# Patient Record
Sex: Female | Born: 1970 | Race: White | Hispanic: No | Marital: Married | Smoking: Never smoker
Health system: Southern US, Community
[De-identification: ages and names within clinical notes are randomized; demographics above are authoritative.]

## PROBLEM LIST (undated history)

## (undated) DIAGNOSIS — G4730 Sleep apnea, unspecified: Secondary | ICD-10-CM

## (undated) DIAGNOSIS — A150 Tuberculosis of lung: Secondary | ICD-10-CM

## (undated) DIAGNOSIS — K5010 Crohn's disease of large intestine without complications: Secondary | ICD-10-CM

## (undated) DIAGNOSIS — E119 Type 2 diabetes mellitus without complications: Secondary | ICD-10-CM

## (undated) HISTORY — PX: COLON SURGERY: SHX602

## (undated) HISTORY — PX: OTHER SURGICAL HISTORY: SHX169

## (undated) HISTORY — PX: ABDOMINAL HYSTERECTOMY: SHX81

## (undated) HISTORY — PX: ILEOSTOMY: SHX1783

## (undated) HISTORY — PX: CHOLECYSTECTOMY: SHX55

## (undated) HISTORY — PX: REDUCTION MAMMAPLASTY: SUR839

---

## 1998-10-17 ENCOUNTER — Emergency Department (HOSPITAL_COMMUNITY): Admission: EM | Admit: 1998-10-17 | Discharge: 1998-10-17 | Payer: Self-pay

## 2007-03-21 ENCOUNTER — Encounter (HOSPITAL_BASED_OUTPATIENT_CLINIC_OR_DEPARTMENT_OTHER): Admission: RE | Admit: 2007-03-21 | Discharge: 2007-05-03 | Payer: Self-pay | Admitting: Internal Medicine

## 2007-04-05 ENCOUNTER — Ambulatory Visit (HOSPITAL_COMMUNITY): Admission: RE | Admit: 2007-04-05 | Discharge: 2007-04-05 | Payer: Self-pay | Admitting: Internal Medicine

## 2007-05-06 ENCOUNTER — Encounter (HOSPITAL_BASED_OUTPATIENT_CLINIC_OR_DEPARTMENT_OTHER): Admission: RE | Admit: 2007-05-06 | Discharge: 2007-06-20 | Payer: Self-pay | Admitting: Surgery

## 2007-06-21 ENCOUNTER — Encounter (HOSPITAL_BASED_OUTPATIENT_CLINIC_OR_DEPARTMENT_OTHER): Admission: RE | Admit: 2007-06-21 | Discharge: 2007-07-31 | Payer: Self-pay | Admitting: Surgery

## 2007-09-13 ENCOUNTER — Encounter (HOSPITAL_BASED_OUTPATIENT_CLINIC_OR_DEPARTMENT_OTHER): Admission: RE | Admit: 2007-09-13 | Discharge: 2007-10-22 | Payer: Self-pay | Admitting: Internal Medicine

## 2008-02-14 ENCOUNTER — Ambulatory Visit: Payer: Self-pay | Admitting: Diagnostic Radiology

## 2008-02-14 ENCOUNTER — Emergency Department (HOSPITAL_BASED_OUTPATIENT_CLINIC_OR_DEPARTMENT_OTHER): Admission: EM | Admit: 2008-02-14 | Discharge: 2008-02-14 | Payer: Self-pay | Admitting: Emergency Medicine

## 2008-04-09 ENCOUNTER — Encounter: Admission: RE | Admit: 2008-04-09 | Discharge: 2008-04-09 | Payer: Self-pay | Admitting: Family Medicine

## 2008-12-07 ENCOUNTER — Emergency Department (HOSPITAL_BASED_OUTPATIENT_CLINIC_OR_DEPARTMENT_OTHER): Admission: EM | Admit: 2008-12-07 | Discharge: 2008-12-07 | Payer: Self-pay | Admitting: Emergency Medicine

## 2009-03-25 ENCOUNTER — Encounter (HOSPITAL_BASED_OUTPATIENT_CLINIC_OR_DEPARTMENT_OTHER): Admission: RE | Admit: 2009-03-25 | Discharge: 2009-05-10 | Payer: Self-pay | Admitting: Internal Medicine

## 2009-03-25 ENCOUNTER — Ambulatory Visit (HOSPITAL_COMMUNITY): Admission: RE | Admit: 2009-03-25 | Discharge: 2009-03-25 | Payer: Self-pay | Admitting: Internal Medicine

## 2009-10-12 ENCOUNTER — Encounter: Admission: RE | Admit: 2009-10-12 | Discharge: 2009-10-12 | Payer: Self-pay | Admitting: Family Medicine

## 2010-05-26 LAB — COMPREHENSIVE METABOLIC PANEL
BUN: 14 mg/dL (ref 6–23)
CO2: 20 mEq/L (ref 19–32)
Calcium: 9.2 mg/dL (ref 8.4–10.5)
Chloride: 104 mEq/L (ref 96–112)
Creatinine, Ser: 0.9 mg/dL (ref 0.4–1.2)
GFR calc Af Amer: >60 mL/min (ref >60)
Glucose, Bld: 147 mg/dL — ABNORMAL HIGH (ref 70–99)
Potassium: 4.5 mEq/L (ref 3.5–5.1)
Sodium: 139 mEq/L (ref 135–145)
Total Protein: 8.6 g/dL — ABNORMAL HIGH (ref 6.0–8.3)

## 2010-07-05 NOTE — Assessment & Plan Note (Signed)
[facility]   NAME:  Sharon Moon, Sharon Moon                ACCOUNT NO.:  [id number]   MEDICAL RECORD NO.:  [id number]      DATE OF BIRTH:  03/30/1970   PHYSICIAN:  Theresia Majors. Tanda Rockers, M.D. VISIT DATE:  06/07/2007                                   OFFICE VISIT   SUBJECTIVE:  Sharon Moon is a 40 year old lady who is undergoing hyperbaric  oxygen treatment as an adjunct to the management of metastatic Crohn  disease.  In the interim, there has been no drainage, no fever, and no  excessive pain.There have been no symptoms referable to barotrauma,  oxygen toxicity, or claustrophobia.   OBJECTIVE:  Blood pressure is 152/88, respirations of 16, pulse rate  100, and temperature 98.9. HEENT:  Clear.  Neck:  Supple.  Lungs:  Clear.  Tympanic membranes are clear.  Inspection of the wound in the  gluteal fold shows continued contraction.  There is very scant clear  exudate.  There is no evidence of active infection or fistulization.  There has been significant reduction in volume and area.   ASSESSMENT:  Clinical response to hyperbaric oxygen.   PLAN:  We will complete a course of HBO for a total of 40 dives.  We  will reevaluate the patient in 1 week.      Harold A. Tanda Rockers, M.D.  Electronically Signed     HAN/MEDQ  D:  06/07/2007  T:  06/08/2007  Job:  [id number]

## 2010-07-05 NOTE — Assessment & Plan Note (Signed)
[facility]   NAME:  Sharon Moon, Sharon Moon                ACCOUNT NO.:  [id number]   MEDICAL RECORD NO.:  [id number]      DATE OF BIRTH:  09-10-1970   PHYSICIAN:  Theresia Majors. Tanda Rockers, M.D. VISIT DATE:  06/14/2007                                   OFFICE VISIT   SUBJECTIVE:  Sharon Moon is a 40 year old lady with severe metastatic  Crohn's disease.  We have been treating her adjunctively with hyperbaric  oxygen in conjunction with her surgeons at [facility].  There has been no  symptoms of barotrauma, claustrophobia, or oxygen toxicity.  She  continues to be ambulatory.  She was evaluated in [facility] this week  and was told that her wounds show impressive improvement and a request  has been made for [place] to continue her HBO until complete  reepithelialization.  There has been no interim pain, fever, or  drainage.   OBJECTIVE:  Blood pressure is 124/79, respirations 16, pulse rate 80,  temperature 98.  The HEENT exam is clear.  The wound shows 100%  granulation.  There is a 1.5-cm sinus at 9 o'clock that is nonfriable.  It easily admits a Q-tip.  There is no abscess.  The periwound tissue is  normal and supple.  No evidence of cellulitis or induration.  There is  advancing epithelium from the periphery.  The wounds were measured,  cataloged, and photographed.  Please refer to the data entries   ASSESSMENT:  Clinical improvement.   PLAN:  We will continue her HBO and continue moist dressings.      Harold A. Tanda Rockers, M.D.  Electronically Signed     HAN/MEDQ  D:  06/14/2007  T:  06/15/2007  Job:  [id number]

## 2010-07-05 NOTE — Assessment & Plan Note (Signed)
[facility]   NAME:  Sharon Moon, Sharon Moon                ACCOUNT NO.:  [id number]   MEDICAL RECORD NO.:  [id number]      DATE OF BIRTH:  12-09-1970   PHYSICIAN:  Theresia Majors. Tanda Rockers, M.D. VISIT DATE:  07/04/2007                                   OFFICE VISIT   SUBJECTIVE:  Sharon Moon is a 40 year old female who is undergoing  hyperbaric oxygen treatment for a malignant Crohn disease.  She has  completed  a total of 50 hyperbaric oxygen treatments.  In the interim,  she has had moderate drainage from the right sinus in the sacral area.  There has been no interim fever.  She continues to be ambulatory,  continues to use daily moist saline dressings.   OBJECTIVE:  Blood pressure is 132/82, respirations 16, pulse rate 96,  and temperature is 98.7.  The patient was examined from the prone  position in the inner gluteal fold.  The wound shows continued  contraction.  There was some yellowish discharge on the plain 0.25 inch  new gauze.  There is no evidence of abscess formation, hyperemia, or  tenderness.  On the EMLA block, wound curette was used to extend into  both sinus tracts at 3 and at 9 o'clock respectively.  No loculations  were discerned.  There was healthy-appearing subcutaneous tissue.  There  was no malodor.  Both sinuses were irrigated and a ribbon packing of  plain new gauze was reapplied.   ASSESSMENT:  Continued improvement of inner gluteal ulcer associated  with malignant Crohn disease.   PLAN:  Sharon Moon will be reevaluated by the surgery service at the  [facility], [place].  At this point, we would  recommend that we discontinue hyperbaric oxygen after 60 and resume her  local care.  Our recommendation will be modified by the recommendation  of her attending surgeon at [facility].  The patient is scheduled to be  reevaluated within the next 2 weeks and we will coordinate our treatment  plans.   We have discussed this approach with  the patient in terms that she seems  to understand.  She expresses gratitude for having been seen in the  clinic.  We will continue hyperbarics and moist moist dressings daily.   There was no excessive bleeding.  The wound was copiously irrigated with  saline.  No cultures were taken.  The subcutaneous tissue and fat  appeared to be normal.      Theresia Majors. Tanda Rockers, M.D.  Electronically Signed     HAN/MEDQ  D:  07/04/2007  T:  07/05/2007  Job:  [id number]

## 2010-07-05 NOTE — Assessment & Plan Note (Signed)
[facility]   NAME:  Sharon Moon, Sharon Moon                ACCOUNT NO.:  [id number]   MEDICAL RECORD NO.:  [id number]      DATE OF BIRTH:  Jan 22, 1971   PHYSICIAN:  Theresia Majors. Tanda Rockers, M.D. VISIT DATE:  06/28/2007                                   OFFICE VISIT   SUBJECTIVE:  Sharon Moon is a 40 year old lady who we are following and  treating with hyperbaric oxygen as an adjunct to the management of  metastatic Crohn disease.  In the interim, there has been no excessive  drainage.  She has no ear pressure or signs of claustrophobia or oxygen  toxicity.  She has completed diet #46.   OBJECTIVE:  Blood pressure is 131/83, respirations 14, pulse rate 90,  temperature 98.5.  The exam of the sacral area shows that there are  sinuses at 3 and 9 o'clock which have decreased significantly.  There is  no drainage whatsoever.  These areas appear to have healthy granulation  throughout.  The size of the sinus will not permit the cotton portion of  a Q-tip but there we are able to sound the tract with the wood end of  the Q-tip.  These were photographed, measured, and cataloged.  Please  refer to the data entries.   ASSESSMENT:  Satisfactory response to hyperbaric oxygen for the  management of metastatic Crohn disease.   PLAN:  We will place a plain new gauze ribbon packing into the wound.  We will irrigate the sinuses each day following her hyperbarics.  We  will continue hyperbarics and plan an interim evaluation at [facility] prior to  discharge.  We have given the patient opportunity to ask questions.  She  seems to understand the plan and expresses gratitude for having been  seen      Jake Shark A. Tanda Rockers, M.D.  Electronically Signed     HAN/MEDQ  D:  06/28/2007  T:  06/29/2007  Job:  [id number]

## 2010-07-05 NOTE — Assessment & Plan Note (Signed)
[facility]   NAME:  Sharon Moon, Sharon Moon                ACCOUNT NO.:  [id number]   MEDICAL RECORD NO.:  [id number]      DATE OF BIRTH:  01-04-1971   PHYSICIAN:  Theresia Majors. Tanda Rockers, M.D. VISIT DATE:  05/03/2007                                   OFFICE VISIT   SUBJECTIVE:  Sharon Moon is a 40 year old female who is undergoing  hyperbaric oxygen treatment for metastatic Crohn's disease. In the  interim, she has been utilizing a hydrogel pad b.i.d. There has been no  excessive drainage. There have been no barotrauma symptoms. No oxygen  toxicity symptoms rule out claustrophobic symptoms.   OBJECTIVE:  Blood pressure 128/79, respiratory rate 16, pulse 95,  temperature 98.5. Capillary blood glucose is 128 mg percent following  her HBO treatment. The patient has completed 17 out of 30 HBO  treatments. Inspection of the wound in the sacrum area shows 100%  granulation with advance in epithelium and scant drainage. There is no  evidence of active infection or fistulazation.   ASSESSMENT:  Clinical improvement attendant with HBO therapy.   PLAN:  We will continue the b.i.d. hydrogel dressings and we will  continue the HBO treatment. We will re-evaluate the patient in one week.      Harold A. Tanda Rockers, M.D.  Electronically Signed     HAN/MEDQ  D:  05/03/2007  T:  05/03/2007  Job:  [id number]

## 2010-07-05 NOTE — Assessment & Plan Note (Signed)
[facility]   NAME:  Sharon Moon, Sharon Moon                ACCOUNT NO.:  [id number]   MEDICAL RECORD NO.:  [id number]      DATE OF BIRTH:  02-May-1970   PHYSICIAN:  Maxwell Caul, M.D. VISIT DATE:  04/05/2007                                   OFFICE VISIT   [name] returns today in followup for her wounds related to metastatic  Crohn's disease.  She was seen in initial evaluation in this clinic last  week.  She has since been approved for a repeat trial of HBO.  Last  week, she had a culture done from the small wounds above her major  surgical wound in her coccyx area.  This came back as MRSA.  We called  in antibiotics doxycycline and rifampin.  Since then, she reports that  the major surgical wound itself is improved.  She has not noticed any  new changes.   On exam, temperature is 98.2, pulse 108, respirations 18, blood pressure  is 139/64.  The area on her surgical wound actually measures 5.2 x 0.4 x  1.  This looks slightly improved from the last time I saw this.  There  was a deeper tunnel which I probed in this last time which does not  appear to be evident.  Superior to this major surgical wound, one of a  small open areas has closed over.  There are only 2 of these versus 3  the last time.  I could express no purulent drainage this time.   IMPRESSION:  Skin wounds related to surgery and treatment for metastatic  Crohn's disease.  The patient has been approved for hyperbaric  oxygenation through insurance.  She will have blood work including a  basic metabolic panel, CBC, a chest x-ray, and an EKG as test for  upcoming hyperbaric oxygenation.  We wrote orders for hyperbaric  oxygenation and explained the risks and benefits to the patient.  She is  already had hyperbaric oxygenation at [facility] and expressed understanding  of the protocol.  She will continue with her WoundVac which will be  changed Monday, Wednesday, and Friday.     ______________________________  Maxwell Caul, M.D.     MGR/MEDQ  D:  04/05/2007  T:  04/07/2007  Job:  [id number]

## 2010-07-05 NOTE — Assessment & Plan Note (Signed)
[facility]   NAME:  Sharon Moon, HABLE                ACCOUNT NO.:  [id number]   MEDICAL RECORD NO.:  [id number]      DATE OF BIRTH:  [birth year]   PHYSICIAN:  Theresia Majors. Tanda Rockers, M.D. VISIT DATE:  05/16/2007                                   OFFICE VISIT   SUBJECTIVE:  Sharon Moon is a 40 year old lady who is undergoing hyperbaric  oxygen treatment as an adjunct to the management of severe complications  of Crohn's disease.  In the interim, she has denied symptoms consistent  with oxygen toxicity or barotrauma.  She returns for wound evaluation.  There has been no interim fever, malodorous drainage or pain.  She  continues to be ambulatory.   OBJECTIVE:  Blood pressure is 134/88, respirations are 14, pulse rate  97, temperature is 98.5.  Inspection of the wound shows a contracting  100% granulating base with advancing epithelium from the periphery.  There is no evidence of drainage.  There is no evidence of tracking,  malodor or fistula formation.  The patient had an interim culture which  showed abundant Staphylococcus aureus.   ASSESSMENT:  Clinical response to hyperbarics and local wound care.   PLAN:  We will continue her hyperbarics with reevaluation in 5 days. We  have chosen not to treat her with antibiotics but rather to depend upon  local antiseptic soap and vigilant wound care.   The patient will have an interim evaluation per Dr. Epifania Gore at [facility] for  independent assessment of progress and response.  We will likely extend  her total HBO treatments to effect complete closure.      Harold A. Tanda Rockers, M.D.  Electronically Signed     HAN/MEDQ  D:  05/16/2007  T:  05/16/2007  Job:  [id number]   cc:   Rae Halsted

## 2010-07-05 NOTE — Assessment & Plan Note (Signed)
[facility]   NAME:  ERCEL, NORMOYLE                ACCOUNT NO.:  [id number]   MEDICAL RECORD NO.:  [id number]      DATE OF BIRTH:  15-May-1970   PHYSICIAN:  Maxwell Caul, M.D. VISIT DATE:  04/26/2007                                   OFFICE VISIT   Mrs. Wurtz is a patient we have been following for wound care evaluation  in conjunction with her HBO for metastatic Crohn's disease.  She has  been to see her surgeon who has discontinued the wound vac and simply  recommended wet to dry. We have been packing this with hydrogel.  She  continues in HBO.   Wound exam. The wound  once again appears clean and well granulated.  There is no evidence of infection.  The superficial candidal infection  she had last time appears much better.  The three small wounds that were  superior to the surgical wound have all closed over.  I do not see any  particular reason for the Protopic at this point.  With regards to the  surgical wound itself, there has been some healing present especially  from the inferior part of the oval-shaped wound.   IMPRESSION:  Wounds related to metastatic Crohn's disease and surgery.  She is continuing with HBO.  There is no need for Protopic now.  I have  recommended hydrogel based packing.  We will continue to follow her in  conjunction with HBO.           ______________________________  Maxwell Caul, M.D.     MGR/MEDQ  D:  04/26/2007  T:  04/26/2007  Job:  [id number]

## 2010-07-05 NOTE — Assessment & Plan Note (Signed)
[facility]   NAME:  Sharon Moon, Sharon Moon                ACCOUNT NO.:  [id number]   MEDICAL RECORD NO.:  [id number]      DATE OF BIRTH:  Jan 22, 1971   PHYSICIAN:  Maxwell Caul, M.D. VISIT DATE:  04/12/2007                                   OFFICE VISIT   [name] was seen today in conjunction with her third HBO treatment.  She is a woman with metastatic Crohn's disease who has a surgical wound  in her buttocks.  All above the area are three small open areas which  initially grew MRSA.  She has been on doxycycline and rifampin for  these.  She has started on HBO which has previously helped her in the  past.   WOUND EXAM:  The surgical wound area in her buttocks has contracted in  size somewhat.  The tissue looks relatively healthy.  There was a small  deeper recess in this wound at one point that appears to have closed  down.  The small area superficially superior to the surgical wound look  much the same.  There is no drainage, however.   IMPRESSION:  Wounds related to metastatic Crohn's disease and surgery.  She is continuing with a wound vac to the surgical wound.  She has  started an HBO.  She is applying Protopic to the smaller areas above  this.  No further antibiotics are deemed necessary.  There was nothing  that appeared to require culturing.  We will follow her in conjunction  with HBO.           ______________________________  Maxwell Caul, M.D.     MGR/MEDQ  D:  04/12/2007  T:  04/13/2007  Job:  [id number]

## 2010-07-05 NOTE — Assessment & Plan Note (Signed)
[facility]   NAME:  Sharon Moon, Sharon Moon                ACCOUNT NO.:  [id number]   MEDICAL RECORD NO.:  [id number]      DATE OF BIRTH:  1970-02-29   PHYSICIAN:  Maxwell Caul, M.D. VISIT DATE:  10/07/2007                                   OFFICE VISIT   LOCATION:  [facility].   Mrs. Dollinger is a lady with known metastatic Crohn disease.  We had  previously treated her for perianal and coccyx involvement with  hyperbaric oxygen which finished in June 2009.  This was done in  conjunction with her gastroenterologist at [facility].  Her area has healed.  I saw her on September 16, 2007.  Again, she had developed a recurrent wound  in her left anterior groin area.  This was a small oval-shaped wound.  She had been given a bump in her prednisone from 10-40 by her  gastroenterologist; however, the wound has not healed.  When I saw her,  I recommended reapplication of Protopic in conjunction with her steroid  challenge.  She has done well.  She states that the wound has largely  closed over and she is here for our final review.   On examination, temperature is 98.4, pulse 88, respirations 16, and  blood pressure 149/95.  The area in the groin has completely healed over  and there is no remaining wound.  Her other areas in the coccyx area  have no wounds at this time.   IMPRESSION:  Wounds related to metastatic Crohn disease.  This has  completely resolved.  At this point, I think she can be discharged again  from the clinic; however, we will be prepared to see her again should  there be any further difficulties.           ______________________________  Maxwell Caul, M.D.     MGR/MEDQ  D:  10/07/2007  T:  10/07/2007  Job:  [id number]

## 2010-07-05 NOTE — Assessment & Plan Note (Signed)
[facility]   NAME:  Sharon Moon, Sharon Moon                ACCOUNT NO.:  [id number]   MEDICAL RECORD NO.:  [id number]      DATE OF BIRTH:  08-29-1970   PHYSICIAN:  Sharon Moon, M.D. VISIT DATE:  03/22/2007                                   OFFICE VISIT   Sharon Moon is a exceedingly complex patient who is referred here for our  review of perianal wounds related to metastatic Crohn disease.  She is  40 years old and was first diagnosed with Crohn disease the a  colonoscopy in 1994.  At that point she had a good response to simple  measures.  However post-pregnancy in the late 1990s, she was started on  Remicade infusions for severe and worsening Crohn disease.  She  developed a perianal fistulas and abscesses in the summer of 2001.  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  Ultimately in 2003  in [place], she required extensive surgery to repair of a hole in the  vaginal wall, and had graft site to repair a wound on her buttocks.  Skin biopsy in 2004 actually showed metastatic Crohn disease and she was  started on Remicade again.   As I understand her current problem, she required surgery on her  buttocks to attempt to clean up a continued fistula formation.  She  ended up with a surgical wound.  This has been very slow to heal.  She  was given a course of 40 dives in 2007 at [facility], which  closed her wounds.  However, he after the dives they ended the wounds  reopened.  She a most recently has had a wound vac to the surgical area  on her buttocks but it had been a major flare ups, including a raised  purple wound in her left groin area.  She now is on monthly infusions  Tysabri and I think is been put back on prednisone.   She has largely been referred here to consider an attempt at a retrial  of hyperbaric Moon.  If I understand the logic here, the hyperbaric  Moon did help with healing of her perianal wounds I think in 2007 at  [facility].  At which time they had nothing to modify the underlying  metastatic Crohn disease.  It is the general feeling at optimistically,  the [facility] will do that.  Hopefully she would have the same response to  hyperbaric Moon.   EXAMINATION:  Temperature 98.1, pulse 102, respirations 16, blood  pressure 124/71.  Generally remarkably well looking despite what she has been through.  SKIN:  The area in question, her major wound actually was on the buttock  over the coccyx area which is a large but well granulated wound  measuring 5.7 x 0.6 x 1.  There is a small deeper recess in this wound.  However, it does not probe to bone.  They have been applying a wound vac  to this changed by home health for several months now.  Cephalad to this  wound, there were 3 smaller open areas over her coccyx area.  One of  these had purulent drainage that I cultured.  I am not certain the exact  pathogenesis and progression of these wounds.  The final wound I looked  that was a raised purple area on her left groin area.  She has never had  surgery in this area.  There is a small opening present.   IMPRESSION:  Metastatic Crohn disease.  This in itself is apparently  made a very rare phenomenon.  To be truthful I had not heard of this  possibility.  Will attempt the female her gastroenterologist for more  information.  Nonetheless, she is on a monthly infusion of Tysabri and  had been put back on prednisone.  The major wound she has looks quite  amendable to a wound vac and I think this should continue.  The base of  this is well granulated.  Although I am not really certain about her  measurements as done by home health.  The smaller wounds cephalad to  this area no doubt have the same pathogenesis.  I have recommended  tacrolimus to this area.  The patient already has some of this at home.  The area on her left groin area I have no  specific recommendations for  and will see how this responds to the recent reduction of steroids.   With regards hyperbaric Moon.  I will see if we can get prior  authorization for this through [facility].  Although this  would be off label use of hyperbaric Moon.  She apparently had an  excellent response as previously with the wounds healing over nicely at  [facility] I think in 2007.  As I understand her justification, they are  hopeful that her current disease modifying medication will do better job  of making sure that recurrence does not happen after treatment.   PLAN:  We have gone ahead and tried to get prior authorization for  hyperbaric Moon.  I have prescribed a mattress overlay for her king  size bed to make sure that we keep the area off-loaded.  I think  tacrolimus might be the best way to address the small wounds just above  her major wound on her coccyx.  She already has some of this.  We will  see her back in two weeks' time, at which time we will see if we prior  authorization to proceed with hyperbaric.  If we do yet prior  authorization she will need a recent chest x-ray, EKG and lab work.           ______________________________  Sharon Moon, M.D.     MGR/MEDQ  D:  03/22/2007  T:  03/22/2007  Job:  [id number]   cc:   Dr. Vernell Barrier  Dr. Clovis Cao

## 2010-07-05 NOTE — Assessment & Plan Note (Signed)
[facility]   NAME:  Sharon Moon, Sharon Moon                ACCOUNT NO.:  [id number]   MEDICAL RECORD NO.:  [id number]      DATE OF BIRTH:  November 24, 1970   PHYSICIAN:  Theresia Majors. Tanda Rockers, M.D.      VISIT DATE:                                   OFFICE VISIT   SUBJECTIVE:  Sharon Moon is a 40 year old lady who is being followed  for malignant Crohn's disease.  She is undergoing hyperbaric oxygen  treatment.  There have been no symptoms referable to claustrophobia,  barotrauma or oxygen toxicity.  She continues to be ambulatory.  There  has been no excessive drainage.  She denies GI complaints altogether.   OBJECTIVE:  VITAL SIGNS:  Her blood pressure is 115/87, respirations of  14, pulse rate 98, temperature 98.6, capillary blood glucose not  applicable.   Examination of the patient from the prone position discloses a  continuously contracting wound.  There are sinuses at 3 and at 9  o'clock.  These are clean.  They were sounded with a Q-tip and extend 3-  1/2 and 3 cm respectively.  There is no malodor.  There is no evidence  of recurrent trauma.  There is no exposed bone.   ASSESSMENT:  Clinical improvement.   PLAN:  We continue the hyperbaric oxygen treatment with moist-moist  dressings and daily cleansing of sinuses.  We will re-evaluate the  patient in 1 week.      Harold A. Tanda Rockers, M.D.  Electronically Signed     HAN/MEDQ  D:  06/21/2007  T:  06/21/2007  Job:  [id number]

## 2010-07-05 NOTE — Assessment & Plan Note (Signed)
[facility]   NAME:  Sharon Moon, Sharon Moon                ACCOUNT NO.:  [id number]   MEDICAL RECORD NO.:  [id number]      DATE OF BIRTH:  09-08-1970   PHYSICIAN:  Theresia Majors. Tanda Rockers, M.D. VISIT DATE:  07/19/2007                                   OFFICE VISIT   SUBJECTIVE:  Sharon Moon is a 40 year old female who is undergoing  adjunctive hyperbaric oxygen therapy for malignant Crohn's disease with  fistulization in the intergluteal area.  She has completed a total of 58  dives out of a total ordered of 60.  There has been no excessive  drainage, malodor, pain, or fever.  She continues to be ambulatory.  There are no symptoms of barotrauma, oxygen toxicity, claustrophobia.   OBJECTIVE:  Blood pressure is 131/87, respirations are 14, pulse rate  82, and temperature 98.5.  The patient was examined in the prone  position.  The previous sinus at 9 o'clock is completely resolved.  The  sinus at 3 o'clock is measured approximately 2 mm and a depth of 4.2 cm.  There is no excessive drainage, malodor, or fluctuance.  There is  absolutely no evidence of abscess.   ASSESSMENT:  Clinical improvement of Crohn's fistula.   PLAN:  Will continue the hyperbaric oxygen for completion of her 60  dives.  We will reevaluate the patient in 1 week.      Harold A. Tanda Rockers, M.D.  Electronically Signed     HAN/MEDQ  D:  07/19/2007  T:  07/20/2007  Job:  [id number]

## 2010-07-05 NOTE — Assessment & Plan Note (Signed)
[facility]   NAME:  Sharon Moon, Sharon Moon                ACCOUNT NO.:  [id number]   MEDICAL RECORD NO.:  [id number]      DATE OF BIRTH:  [birth year]   PHYSICIAN:  Theresia Majors. Tanda Rockers, M.D. VISIT DATE:  05/23/2007                                   OFFICE VISIT   SUBJECTIVE:  Sharon Moon is a 40 year old female who is being seen in  consultation with [facility] Department of Surgery for hyperbaric oxygen  treatment of metastatic Crohn's disease.  She has completed a total of  30 HBO treatments. In the interim she has been seen by her surgeon, Dr.  Epifania Gore at [facility].  A request has been made to continue with the hyperbaric  oxygen treatment.  There has been no interim drainage.  There is no  malodor.  There is some moisture on her undergarment but there has been  no bloody drainage.  There has been no interim fever or GI symptoms.  Appetite remains good.  There are no symptoms of barotrauma,  claustrophobia or oxygen toxicity.   OBJECTIVE:  Blood pressure is 131/78, pulse rate 107, respirations 18,  temperature 99.4.  HEENT exam is clear.  The patient is examined from  the prone position.  The ulcer in the inner gluteal crease is clean.  There is a minimum waxy exudate which was mechanically debrided with a  4x4 gauze.  There was minimum hemorrhage, minimum pain.   ASSESSMENT:  Clinical response to hyperbaric oxygen treatment.   PLAN:  We will request a renewal for 30 dives from her insurance  carrier, continue her hyperbaric oxygen treatment as per previous  orders.  The end point of her therapy will be 30 additional dives or  complete closure.  We will reevaluate her in 5 days.      Harold A. Tanda Rockers, M.D.  Electronically Signed     HAN/MEDQ  D:  05/23/2007  T:  05/23/2007  Job:  [id number]

## 2010-07-05 NOTE — Assessment & Plan Note (Signed)
[facility]   NAME:  Sharon Moon, QUINONES                ACCOUNT NO.:  [id number]   MEDICAL RECORD NO.:  [id number]      DATE OF BIRTH:  04/06/1970   PHYSICIAN:  Maxwell Caul, M.D.      VISIT DATE:                                   OFFICE VISIT   Ms. [name] is a lady with a known metastatic Crohn disease.  We had  treated her for a perianal and coccyx involvement with hyperbaric oxygen  finishing at the beginning of June, she did well.  Her areas have  healed.  Per the patient, unfortunately, she developed a recurrent wound  in the left anterior groin area roughly in the femoral area.  The small  oval-shaped wound.  Her gastroenterologist in [facility] ([place]) gave her a  course of oral steroids, which per the patient has usually helped these  areas.  However, this is not healing and she arrived today in  consultation for [place] to look at this.   On examination, temperature is 98.5, pulse 85, respirations 18, and  blood pressure 154/89, indeed over the left groin area anteriorly is a  small circular wounds measuring 1.2 x 0.9 x 0.5.  This does not have any  evidence of infection.  The base of the wound appears to be granulating  appropriately, there is no substance to this.  Nothing is palpable and  there is no lymphadenopathy.   IMPRESSION:  Groin wound, secondary to probable metastatic Crohn  disease.  Although, I thought there was a differential to this, the  patient was able to show me and her right groin area wounds that have  actually looked similar to this one that has healed with the challenges  of oral steroids.  This one has not.  It does not appear to be infected.  It appears to be adequately hydrated.  There is no evidence of  surrounding cellulitis.  I have recommended applying Protopic 0.1%  b.i.d. to this wound covered with a bulky dressing to protect her from  irritation from her clothes.  At this point, I do not think this needs  anything additionally.  The  patient states that in the past, she has  been treated with initial wound steroid injections.  We cannot do that  here, I am not sure that I would think that that should be helpful in  any case.  We will see her back in clinic in 3 weeks' time.  The patient  was given a prescription for Protopic, she already has some at home and  we will see her in that timeframe earlier if necessary.           ______________________________  Maxwell Caul, M.D.     MGR/MEDQ  D:  09/16/2007  T:  09/17/2007  Job:  [id number]

## 2010-07-05 NOTE — Assessment & Plan Note (Signed)
[facility]   NAME:  Sharon Moon, Sharon Moon                ACCOUNT NO.:  [id number]   MEDICAL RECORD NO.:  [id number]      DATE OF BIRTH:  1970-12-12   PHYSICIAN:  Theresia Majors. Tanda Rockers, M.D. VISIT DATE:  07/12/2007                                   OFFICE VISIT   SUBJECTIVE:  Sharon Moon is a 40 year old lady who we are treating with  adjunctive hyperbaric oxygen therapy for malignant Crohn's disease  involving the intergluteal area.  In the interim, she has denied  excessive drainage, malodor, pain, or fever.  She has no GI symptoms.   She was seen earlier today by Dr. Clovis Cao at [facility] who has requested  that we continue her hyperbaric oxygen treatment until a complete  closure is effective.  She will be reevaluated by Dr. Karel Jarvis in 3 months.  Dr. Rae Halsted, surgeon has discharged her from active management.   She is tolerating the hyperbaric oxygen without difficulty.  She denies  any symptoms whatsoever claustrophobia, sinusitis, ear pain, or sinus  pressure.   OBJECTIVE:  Blood pressure is 128/86, respirations are 16, pulse rate  90, and temperature is 98.6.  Inspection of the intergluteal area shows  that the sinuses have remained patent at 3 o'clock.  The external  orifice is small and only permits the wooden portion of the Q-tip.  On  the right, the cotton tip is easily inserted into the sinus and extends  2.5 cm.  There is no abscess or loculation discernible.  The periwound  area is noninflamed, and there is no induration.  There is no malodor.   ASSESSMENT:  Continued improvement of wound, concurrent with hyperbaric  oxygen treatment.   PLAN:  We will continue the hyperbaric oxygen therapy at the current  settings.  We will reevaluate the patient in 5 days or after 5  additional treatments.      Harold A. Tanda Rockers, M.D.  Electronically Signed     HAN/MEDQ  D:  07/12/2007  T:  07/13/2007  Job:  [id number]   cc:   Jacklynn Ganong

## 2010-07-05 NOTE — Assessment & Plan Note (Signed)
[facility]   NAME:  Sharon Moon, Sharon Moon                ACCOUNT NO.:  [id number]   MEDICAL RECORD NO.:  [id number]      DATE OF BIRTH:  February 05, 1971   PHYSICIAN:  Maxwell Caul, M.D.      VISIT DATE:                                   OFFICE VISIT   [name] was seen today in conjunction with her HBO treatment.  She had  no complications during the dive.   On examination, her wound measurement currently measures 4.2 x 1.2 x  1.6.  There is some improvement in the length of the wound.  The tissue  continues to look well-granulated.  The small open area is superiorly  now measure 2 and are much smaller than what I remember.  She had  evidence of surrounding Candida around the wound, to which we applied  ketoconazole and gave her a prescription for Diflucan.   IMPRESSION:  Surgical wound, right buttock, with underlying metastatic  Crohn's disease.  We will continue with a wound vac as ordered and HBO.  I have given her prescriptions for Diflucan, for the candida surrounding  the wound.   She will continue using Protopic to the small areas above this.  No  further antibiotics are deemed necessary.  She will continue with HBO.           ______________________________  Maxwell Caul, M.D.     MGR/MEDQ  D:  04/19/2007  T:  04/20/2007  Job:  [id number]

## 2010-07-05 NOTE — Assessment & Plan Note (Signed)
[facility]   NAME:  Sharon Moon, Sharon Moon                ACCOUNT NO.:  [id number]   MEDICAL RECORD NO.:  [id number]      DATE OF BIRTH:  08/10/1970   PHYSICIAN:  Theresia Majors. Tanda Rockers, M.D. VISIT DATE:  07/26/2007                                   OFFICE VISIT   SUBJECTIVE:  Sharon Moon is a 40 year old female who we had followed for  malignant Crohn disease with sinuses in the sacrococcygeal area.  She  has undergone hyperbaric oxygen treatments, a total of 62.  She returns  for followup.  There has been no drainage, malodor, pain, or fever.  She  has no symptoms referable to barotrauma, oxygen toxicity, or  claustrophobia.   OBJECTIVE:  Blood pressure is 133/87, respirations 16, pulse rate 73,  and temperature 98.  The HEENT exam is clear.  Inspection of the  intergluteal area in the sacrococcygeal portion discloses that there is  a very minimum residual of the previous sinus.  The wound barely admits  the tip of a Q-tip, and there is no depth.  There is no drainage.  There  is no malodor.   ASSESSMENT:  Essential resolution of sinuses.   PLAN:  We are discontinuing the hyperbaric oxygen treatment.  We are  discharging the patient to return to the care of her primary care  physician and her referring physicians.  We have advised her to continue  daily tub baths utilizing antibacterial soap i.e., Liquid Dial.  We will  reevaluate her on a p.r.n. basis.   We have given the patient an opportunity to ask questions.  She  understands the instructions and expresses gratitude for having been  seen in the clinic.      Harold A. Tanda Rockers, M.D.  Electronically Signed     HAN/MEDQ  D:  07/26/2007  T:  07/27/2007  Job:  [id number]   cc:   Lorin Picket E. Karel Jarvis, MD  Loraine Leriche Epifania Gore

## 2010-07-05 NOTE — Assessment & Plan Note (Signed)
[facility]   NAME:  [name], [name]                ACCOUNT NO.:  [id number]   MEDICAL RECORD NO.:  [id number]      DATE OF BIRTH:  [birth year]   PHYSICIAN:  Maxwell Caul, M.D. VISIT DATE:  05/10/2007                                   OFFICE VISIT   Sharon Moon is a patient here we have been following in conjunction with  HBO for metastatic Crohn's disease.  She is also followed with her  surgeon in [place].  She has been treated with hydrogel to the  surgical wound on her buttocks area.  She also has had recurrently small  open areas in her coccyx area.  She is continued in HBO.   WOUND EXAM:  The wound continues to contract mostly from the inferior  aspect.  The base of this has healthy looking granulation.  There is no  evidence of infection.  No evidence of surrounding candidal reaction.  She does have two small areas cephalad to the actual surgical wound we  are treating.  One of these did have a fair amount of drainage today  which I cultured.  There was no overt evidence of cellulitis.  I did not  start her on empiric antibiotics.   IMPRESSION:  Wound related to metastatic Crohn's disease and recent  surgery.  She is continuing with HBO.  She will continue with hydrogel  packing.  She continues to applied Protopic to the very tiny open areas  cephalad to her surgical wound.  We will continue to follow her in  conjunction with HBO.   She had questions about sitting which I think she could do as long as  she keeps her weight over her ischial tuberosities.  I do not think she  would be able to maintain this for long.  We talked about this in some  detail.           ______________________________  Maxwell Caul, M.D.     MGR/MEDQ  D:  05/10/2007  T:  05/10/2007  Job:  [id number]

## 2010-10-12 ENCOUNTER — Other Ambulatory Visit: Payer: Self-pay | Admitting: Family Medicine

## 2010-10-12 DIAGNOSIS — Z1231 Encounter for screening mammogram for malignant neoplasm of breast: Secondary | ICD-10-CM

## 2010-10-20 ENCOUNTER — Ambulatory Visit (HOSPITAL_BASED_OUTPATIENT_CLINIC_OR_DEPARTMENT_OTHER)
Admission: RE | Admit: 2010-10-20 | Discharge: 2010-10-20 | Disposition: A | Discharge status: Home / Self Care | Payer: BC Managed Care – PPO | Source: Ambulatory Visit | Attending: Family Medicine | Admitting: Family Medicine

## 2010-10-20 DIAGNOSIS — Z1231 Encounter for screening mammogram for malignant neoplasm of breast: Secondary | ICD-10-CM

## 2010-11-11 LAB — DIFFERENTIAL
Basophils Absolute: 0
Eosinophils Relative: 1
Lymphs Abs: 4.3 — ABNORMAL HIGH
Monocytes Relative: 6
Neutrophils Relative %: 66

## 2010-11-11 LAB — CBC
MCHC: 34.1
MCV: 89.8
RBC: 5.2 — ABNORMAL HIGH
RDW: 24.9 — ABNORMAL HIGH

## 2010-11-11 LAB — BASIC METABOLIC PANEL
BUN: 12
CO2: 25
Calcium: 10.1
Chloride: 102
Creatinine, Ser: 0.74
Potassium: 4.4

## 2011-11-14 ENCOUNTER — Other Ambulatory Visit (HOSPITAL_BASED_OUTPATIENT_CLINIC_OR_DEPARTMENT_OTHER): Payer: Self-pay | Admitting: Obstetrics and Gynecology

## 2011-11-14 DIAGNOSIS — Z1231 Encounter for screening mammogram for malignant neoplasm of breast: Secondary | ICD-10-CM

## 2011-11-16 ENCOUNTER — Ambulatory Visit (HOSPITAL_BASED_OUTPATIENT_CLINIC_OR_DEPARTMENT_OTHER)
Admission: RE | Admit: 2011-11-16 | Discharge: 2011-11-16 | Disposition: A | Discharge status: Home / Self Care | Payer: BC Managed Care – PPO | Source: Ambulatory Visit | Attending: Obstetrics and Gynecology | Admitting: Obstetrics and Gynecology

## 2011-11-16 DIAGNOSIS — Z1231 Encounter for screening mammogram for malignant neoplasm of breast: Secondary | ICD-10-CM | POA: Insufficient documentation

## 2012-12-03 ENCOUNTER — Other Ambulatory Visit (HOSPITAL_BASED_OUTPATIENT_CLINIC_OR_DEPARTMENT_OTHER): Payer: Self-pay | Admitting: Obstetrics and Gynecology

## 2012-12-03 DIAGNOSIS — Z1231 Encounter for screening mammogram for malignant neoplasm of breast: Secondary | ICD-10-CM

## 2012-12-11 ENCOUNTER — Ambulatory Visit (HOSPITAL_BASED_OUTPATIENT_CLINIC_OR_DEPARTMENT_OTHER)
Admission: RE | Admit: 2012-12-11 | Discharge: 2012-12-11 | Disposition: A | Discharge status: Home / Self Care | Payer: BC Managed Care – PPO | Source: Ambulatory Visit | Attending: Obstetrics and Gynecology | Admitting: Obstetrics and Gynecology

## 2012-12-11 DIAGNOSIS — Z1231 Encounter for screening mammogram for malignant neoplasm of breast: Secondary | ICD-10-CM

## 2013-12-02 ENCOUNTER — Other Ambulatory Visit (HOSPITAL_BASED_OUTPATIENT_CLINIC_OR_DEPARTMENT_OTHER): Payer: Self-pay | Admitting: Obstetrics and Gynecology

## 2013-12-02 DIAGNOSIS — Z1231 Encounter for screening mammogram for malignant neoplasm of breast: Secondary | ICD-10-CM

## 2013-12-19 ENCOUNTER — Ambulatory Visit (HOSPITAL_BASED_OUTPATIENT_CLINIC_OR_DEPARTMENT_OTHER)
Admission: RE | Admit: 2013-12-19 | Discharge: 2013-12-19 | Disposition: A | Discharge status: Home / Self Care | Payer: BC Managed Care – PPO | Source: Ambulatory Visit | Attending: Diagnostic Radiology | Admitting: Diagnostic Radiology

## 2013-12-19 DIAGNOSIS — Z1231 Encounter for screening mammogram for malignant neoplasm of breast: Secondary | ICD-10-CM | POA: Insufficient documentation

## 2014-04-01 ENCOUNTER — Encounter (HOSPITAL_BASED_OUTPATIENT_CLINIC_OR_DEPARTMENT_OTHER): Payer: Self-pay | Admitting: *Deleted

## 2014-04-01 ENCOUNTER — Emergency Department (HOSPITAL_BASED_OUTPATIENT_CLINIC_OR_DEPARTMENT_OTHER): Payer: BLUE CROSS/BLUE SHIELD

## 2014-04-01 ENCOUNTER — Emergency Department (HOSPITAL_BASED_OUTPATIENT_CLINIC_OR_DEPARTMENT_OTHER)
Admission: EM | Admit: 2014-04-01 | Discharge: 2014-04-01 | Disposition: A | Discharge status: Home / Self Care | Payer: BLUE CROSS/BLUE SHIELD | Attending: Emergency Medicine | Admitting: Emergency Medicine

## 2014-04-01 DIAGNOSIS — N200 Calculus of kidney: Secondary | ICD-10-CM | POA: Diagnosis not present

## 2014-04-01 DIAGNOSIS — Z8719 Personal history of other diseases of the digestive system: Secondary | ICD-10-CM | POA: Diagnosis not present

## 2014-04-01 DIAGNOSIS — Z8611 Personal history of tuberculosis: Secondary | ICD-10-CM | POA: Insufficient documentation

## 2014-04-01 DIAGNOSIS — R1031 Right lower quadrant pain: Secondary | ICD-10-CM | POA: Diagnosis present

## 2014-04-01 DIAGNOSIS — N23 Unspecified renal colic: Secondary | ICD-10-CM | POA: Diagnosis not present

## 2014-04-01 DIAGNOSIS — N201 Calculus of ureter: Secondary | ICD-10-CM | POA: Diagnosis not present

## 2014-04-01 DIAGNOSIS — E119 Type 2 diabetes mellitus without complications: Secondary | ICD-10-CM | POA: Diagnosis not present

## 2014-04-01 DIAGNOSIS — R109 Unspecified abdominal pain: Secondary | ICD-10-CM

## 2014-04-01 DIAGNOSIS — Z8669 Personal history of other diseases of the nervous system and sense organs: Secondary | ICD-10-CM | POA: Insufficient documentation

## 2014-04-01 HISTORY — DX: Type 2 diabetes mellitus without complications: E11.9

## 2014-04-01 HISTORY — DX: Sleep apnea, unspecified: G47.30

## 2014-04-01 HISTORY — DX: Tuberculosis of lung: A15.0

## 2014-04-01 HISTORY — DX: Crohn's disease of large intestine without complications: K50.10

## 2014-04-01 LAB — COMPREHENSIVE METABOLIC PANEL
ALBUMIN: 4.1 g/dL (ref 3.5–5.2)
ALK PHOS: 101 U/L (ref 39–117)
ALT: 67 U/L — ABNORMAL HIGH (ref 0–35)
AST: 71 U/L — AB (ref 0–37)
Anion gap: 5 (ref 5–15)
BILIRUBIN TOTAL: 0.9 mg/dL (ref 0.3–1.2)
BUN: 12 mg/dL (ref 6–23)
CHLORIDE: 106 mmol/L (ref 96–112)
CO2: 24 mmol/L (ref 19–32)
Calcium: 9.3 mg/dL (ref 8.4–10.5)
Creatinine, Ser: 1.08 mg/dL (ref 0.50–1.10)
GFR calc Af Amer: 71 mL/min — ABNORMAL LOW (ref >90)
GFR calc non Af Amer: 61 mL/min — ABNORMAL LOW (ref >90)
Glucose, Bld: 116 mg/dL — ABNORMAL HIGH (ref 70–99)
POTASSIUM: 4.1 mmol/L (ref 3.5–5.1)
Sodium: 135 mmol/L (ref 135–145)
TOTAL PROTEIN: 8.4 g/dL — AB (ref 6.0–8.3)

## 2014-04-01 LAB — CBC WITH DIFFERENTIAL/PLATELET
BASOS ABS: 0 10*3/uL (ref 0.0–0.1)
Basophils Relative: 1 % (ref 0–1)
Eosinophils Absolute: 0.1 10*3/uL (ref 0.0–0.7)
Eosinophils Relative: 1 % (ref 0–5)
HEMATOCRIT: 42.5 % (ref 36.0–46.0)
Hemoglobin: 14.1 g/dL (ref 12.0–15.0)
LYMPHS PCT: 36 % (ref 12–46)
Lymphs Abs: 2.6 10*3/uL (ref 0.7–4.0)
MCH: 31.8 pg (ref 26.0–34.0)
MCHC: 33.2 g/dL (ref 30.0–36.0)
MCV: 95.7 fL (ref 78.0–100.0)
Monocytes Absolute: 0.7 10*3/uL (ref 0.1–1.0)
Monocytes Relative: 9 % (ref 3–12)
NEUTROS ABS: 3.8 10*3/uL (ref 1.7–7.7)
Neutrophils Relative %: 53 % (ref 43–77)
PLATELETS: 207 10*3/uL (ref 150–400)
RBC: 4.44 MIL/uL (ref 3.87–5.11)
RDW: 13.5 % (ref 11.5–15.5)
WBC: 7.1 10*3/uL (ref 4.0–10.5)

## 2014-04-01 LAB — URINALYSIS, ROUTINE W REFLEX MICROSCOPIC
BILIRUBIN URINE: NEGATIVE
GLUCOSE, UA: NEGATIVE mg/dL
Ketones, ur: 15 mg/dL — AB
Nitrite: NEGATIVE
PH: 5 (ref 5.0–8.0)
Protein, ur: NEGATIVE mg/dL
SPECIFIC GRAVITY, URINE: 1.015 (ref 1.005–1.030)
UROBILINOGEN UA: 0.2 mg/dL (ref 0.0–1.0)

## 2014-04-01 LAB — URINE MICROSCOPIC-ADD ON

## 2014-04-01 LAB — I-STAT CG4 LACTIC ACID, ED
LACTIC ACID, VENOUS: 2.7 mmol/L — AB (ref 0.5–2.0)
Lactic Acid, Venous: 2.46 mmol/L (ref 0.5–2.0)

## 2014-04-01 MED ORDER — MORPHINE SULFATE 4 MG/ML IJ SOLN
4.0000 mg | Freq: Once | INTRAMUSCULAR | Status: AC
  Administered 2014-04-01: 4 mg via INTRAVENOUS
  Filled 2014-04-01: qty 1

## 2014-04-01 MED ORDER — HYDROMORPHONE HCL 1 MG/ML IJ SOLN
1.0000 mg | Freq: Once | INTRAMUSCULAR | Status: AC
  Administered 2014-04-01: 1 mg via INTRAVENOUS
  Filled 2014-04-01: qty 1

## 2014-04-01 MED ORDER — PROMETHAZINE HCL 25 MG/ML IJ SOLN
25.0000 mg | Freq: Once | INTRAMUSCULAR | Status: AC
Start: 2014-04-01 — End: 2014-04-01
  Administered 2014-04-01: 25 mg via INTRAVENOUS
  Filled 2014-04-01: qty 1

## 2014-04-01 MED ORDER — IOHEXOL 300 MG/ML  SOLN
100.0000 mL | Freq: Once | INTRAMUSCULAR | Status: AC | PRN
  Administered 2014-04-01: 100 mL via INTRAVENOUS

## 2014-04-01 MED ORDER — OXYCODONE-ACETAMINOPHEN 5-325 MG PO TABS
2.0000 | ORAL_TABLET | Freq: Once | ORAL | Status: AC
  Administered 2014-04-01: 2 via ORAL
  Filled 2014-04-01: qty 2

## 2014-04-01 MED ORDER — ONDANSETRON HCL 4 MG/2ML IJ SOLN
4.0000 mg | Freq: Once | INTRAMUSCULAR | Status: AC
  Administered 2014-04-01: 4 mg via INTRAVENOUS
  Filled 2014-04-01: qty 2

## 2014-04-01 MED ORDER — OXYCODONE-ACETAMINOPHEN 5-325 MG PO TABS: 1.0000 | ORAL_TABLET | ORAL | Status: AC | PRN

## 2014-04-01 MED ORDER — IOHEXOL 300 MG/ML  SOLN
50.0000 mL | Freq: Once | INTRAMUSCULAR | Status: AC | PRN
  Administered 2014-04-01: 50 mL via ORAL

## 2014-04-01 MED ORDER — DIAZEPAM 5 MG/ML IJ SOLN
5.0000 mg | Freq: Once | INTRAMUSCULAR | Status: AC
Start: 2014-04-01 — End: 2014-04-01
  Administered 2014-04-01: 5 mg via INTRAVENOUS
  Filled 2014-04-01: qty 2

## 2014-04-01 MED ORDER — ONDANSETRON 4 MG PO TBDP: ORAL_TABLET | ORAL | Status: AC

## 2014-04-01 NOTE — ED Notes (Signed)
EDPA notified of pt request for additional pain med

## 2014-04-01 NOTE — Discharge Instructions (Signed)
Please do not take your Metformin for 48 hours to allow your kidneys to process the CT scan dye.   1. Medications: percocet, usual home medications 2. Treatment: rest, drink plenty of fluids,  3. Follow Up: Please followup with urology in 2-5 days days for discussion of your diagnoses and further evaluation after today's visit; if you do not have a primary care doctor use the resource guide provided to find one; Please return to the ER for intractable vomiting, worsening pain or fevers.   Kidney Stones Kidney stones (urolithiasis) are deposits that form inside your kidneys. The intense pain is caused by the stone moving through the urinary tract. When the stone moves, the ureter goes into spasm around the stone. The stone is usually passed in the urine.  CAUSES   A disorder that makes certain neck glands produce too much parathyroid hormone (primary hyperparathyroidism).  A buildup of uric acid crystals, similar to gout in your joints.  Narrowing (stricture) of the ureter.  A kidney obstruction present at birth (congenital obstruction).  Previous surgery on the kidney or ureters.  Numerous kidney infections. SYMPTOMS   Feeling sick to your stomach (nauseous).  Throwing up (vomiting).  Blood in the urine (hematuria).  Pain that usually spreads (radiates) to the groin.  Frequency or urgency of urination. DIAGNOSIS   Taking a history and physical exam.  Blood or urine tests.  CT scan.  Occasionally, an examination of the inside of the urinary bladder (cystoscopy) is performed. TREATMENT   Observation.  Increasing your fluid intake.  Extracorporeal shock wave lithotripsy--This is a noninvasive procedure that uses shock waves to break up kidney stones.  Surgery may be needed if you have severe pain or persistent obstruction. There are various surgical procedures. Most of the procedures are performed with the use of small instruments. Only small incisions are needed to  accommodate these instruments, so recovery time is minimized. The size, location, and chemical composition are all important variables that will determine the proper choice of action for you. Talk to your health care provider to better understand your situation so that you will minimize the risk of injury to yourself and your kidney.  HOME CARE INSTRUCTIONS   Drink enough water and fluids to keep your urine clear or pale yellow. This will help you to pass the stone or stone fragments.  Strain all urine through the provided strainer. Keep all particulate matter and stones for your health care provider to see. The stone causing the pain may be as small as a grain of salt. It is very important to use the strainer each and every time you pass your urine. The collection of your stone will allow your health care provider to analyze it and verify that a stone has actually passed. The stone analysis will often identify what you can do to reduce the incidence of recurrences.  Only take over-the-counter or prescription medicines for pain, discomfort, or fever as directed by your health care provider.  Make a follow-up appointment with your health care provider as directed.  Get follow-up X-rays if required. The absence of pain does not always mean that the stone has passed. It may have only stopped moving. If the urine remains completely obstructed, it can cause loss of kidney function or even complete destruction of the kidney. It is your responsibility to make sure X-rays and follow-ups are completed. Ultrasounds of the kidney can show blockages and the status of the kidney. Ultrasounds are not associated with any radiation  and can be performed easily in a matter of minutes. SEEK MEDICAL CARE IF:  You experience pain that is progressive and unresponsive to any pain medicine you have been prescribed. SEEK IMMEDIATE MEDICAL CARE IF:   Pain cannot be controlled with the prescribed medicine.  You have a  fever or shaking chills.  The severity or intensity of pain increases over 18 hours and is not relieved by pain medicine.  You develop a new onset of abdominal pain.  You feel faint or pass out.  You are unable to urinate. MAKE SURE YOU:   Understand these instructions.  Will watch your condition.  Will get help right away if you are not doing well or get worse. Document Released: 02/06/2005 Document Revised: 10/09/2012 Document Reviewed: 07/10/2012 [facility] Patient Information 2015 [place], [place]. This information is not intended to replace advice given to you by your health care provider. Make sure you discuss any questions you have with your health care provider.

## 2014-04-01 NOTE — ED Notes (Signed)
Pt c/o lower abd pain radiating around to her back  That began at 8:00 am. 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 She sts that she can normally feel when she has a blockage but this feels like mostly gas.

## 2014-04-01 NOTE — ED Provider Notes (Signed)
CSN: [id number]     Arrival date & time 04/01/14  1049 History   First MD Initiated Contact with Patient 04/01/14 1210     Chief Complaint  Patient presents with  . Abdominal Pain     (Consider location/radiation/quality/duration/timing/severity/associated sxs/prior Treatment) The history is provided by the patient and medical records. No language interpreter was used.      Sharon Moon is a 44 y.o. female  with a hx of Crohn's colitis, SBO, NIDDM, h/o proctocolectomy, positive TB test (on Rifampin 3 days) presents to the Emergency Department complaining of gradual, persistent, progressively worsening generalized abd pain worse in the bilateral lower abd with associated nausea onset 8am this morning. Associated symptoms include lower abd pressure and nausea.  Pt reports no urinary symptoms.  Pt reports her colostomy bag was full of feces this morning per the usual and she has had minimal out put since that time.  Nothing makes symptoms better or worse.  No treatments PTA.  Pt denies fever, chills, headache, neck pain, CP, SOB, vomiting, diarrhea, weakness, dizziness, syncope, dysuria.  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  Pt reports both previous SBOs were resolved medically.  Last one was approx 1 year ago.    Past Medical History  Diagnosis Date  . Crohn's colitis   . Sleep apnea   . Diabetes mellitus without complication   . TB (pulmonary tuberculosis)    Past Surgical History  Procedure Laterality Date  . Abdominal hysterectomy    . Ileostomy    . Cholecystectomy    . Colon surgery    . Proctocoloectomy    . Vaginal flap     No family history on file. History  Substance Use Topics  . Smoking status: Never Smoker   . Smokeless tobacco: Not on file  . Alcohol Use: No   OB History    No data available     Review of Systems  Constitutional: Negative for fever, diaphoresis, appetite change, fatigue and unexpected weight change.  HENT: Negative for mouth sores.    Eyes: Negative for visual disturbance.  Respiratory: Negative for cough, chest tightness, shortness of breath and wheezing.   Cardiovascular: Negative for chest pain.  Gastrointestinal: Positive for abdominal pain. Negative for nausea, vomiting, diarrhea and constipation.  Endocrine: Negative for polydipsia, polyphagia and polyuria.  Genitourinary: Negative for dysuria, urgency, frequency and hematuria.  Musculoskeletal: Negative for back pain and neck stiffness.  Skin: Negative for rash.  Allergic/Immunologic: Negative for immunocompromised state.  Neurological: Negative for syncope, light-headedness and headaches.  Hematological: Does not bruise/bleed easily.  Psychiatric/Behavioral: Negative for sleep disturbance. The patient is not nervous/anxious.       Allergies  Reglan and Vancomycin  Home Medications   Prior to Admission medications   Medication Sig Start Date End Date Taking? Authorizing Provider  ondansetron (ZOFRAN ODT) 4 MG disintegrating tablet 4mg  ODT q4 hours prn nausea/vomit 04/01/14   Kendel Bessey, PA-C  oxyCODONE-acetaminophen (PERCOCET) 5-325 MG per tablet Take 1-2 tablets by mouth every 4 (four) hours as needed. 04/01/14   Johannes Everage, PA-C   BP 126/64 mmHg  Pulse 83  Temp(Src) 98.6 F (37 C) (Oral)  Resp 18  Ht 5\' 7"  (1.702 m)  Wt 292 lb (132.45 kg)  BMI 45.72 kg/m2  SpO2 96%  LMP 10/18/2010 Physical Exam  Constitutional: She appears well-developed and well-nourished.  HENT:  Head: Normocephalic and atraumatic.  Mouth/Throat: Oropharynx is clear and moist.  Eyes: Conjunctivae are normal. No scleral  icterus.  Cardiovascular: Normal rate, regular rhythm, normal heart sounds and intact distal pulses.   Pulmonary/Chest: Effort normal and breath sounds normal.  Abdominal: Soft. She exhibits no distension, no fluid wave and no mass. Bowel sounds are decreased. There is tenderness in the right lower quadrant and left lower quadrant. There is  guarding. There is no rebound and no CVA tenderness.  Right lower and left lower quadrant abdominal pain with guarding, no rebound or peritoneal signs No CVA tenderness Ileostomy in place in the right lower quadrant with mildly erythematous borders but no purulent drainage, no diarrhea in the colostomy bag Bowel sounds decreased but present No distention or fluid wave noted  Neurological: She is alert. She exhibits normal muscle tone. Coordination normal.  Skin: Skin is warm and dry. No erythema.  Psychiatric: She has a normal mood and affect.  Nursing note and vitals reviewed.   ED Course  Procedures (including critical care time) Labs Review Labs Reviewed  COMPREHENSIVE METABOLIC PANEL - Abnormal; Notable for the following:    Glucose, Bld 116 (*)    Total Protein 8.4 (*)    AST 71 (*)    ALT 67 (*)    GFR calc non Af Amer 61 (*)    GFR calc Af Amer 71 (*)    All other components within normal limits  URINALYSIS, ROUTINE W REFLEX MICROSCOPIC - Abnormal; Notable for the following:    Color, Urine ORANGE (*)    Hgb urine dipstick TRACE (*)    Ketones, ur 15 (*)    Leukocytes, UA SMALL (*)    All other components within normal limits  URINE MICROSCOPIC-ADD ON - Abnormal; Notable for the following:    Squamous Epithelial / LPF FEW (*)    All other components within normal limits  I-STAT CG4 LACTIC ACID, ED - Abnormal; Notable for the following:    Lactic Acid, Venous 2.70 (*)    All other components within normal limits  I-STAT CG4 LACTIC ACID, ED - Abnormal; Notable for the following:    Lactic Acid, Venous 2.46 (*)    All other components within normal limits  CBC WITH DIFFERENTIAL/PLATELET    Imaging Review Ct Abdomen Pelvis W Contrast  04/01/2014   CLINICAL DATA:  Lower abdominal pain with nausea up. History of Crohn's disease with proctocolectomy. Ileostomy. Additional history of hysterectomy, appendectomy and cholecystectomy.  EXAM: CT ABDOMEN AND PELVIS WITH CONTRAST   TECHNIQUE: Multidetector CT imaging of the abdomen and pelvis was performed using the standard protocol following bolus administration of intravenous contrast.  CONTRAST:  50mL OMNIPAQUE IOHEXOL 300 MG/ML SOLN, OMNIPAQUE IOHEXOL 300 MG/ML SOLN  COMPARISON:  CT 10/10/2012  FINDINGS: Lower chest:  Lung bases are clear.  Hepatobiliary: No focal hepatic lesion.  Post cholecystectomy.  Pancreas: Pancreas is normal. No ductal dilatation. No pancreatic inflammation.  Spleen: Normal spleen  Adrenals/urinary tract: Adrenal glands are normal.  There is renal edema and hydronephrosis of the right kidney. There is hydroureter on the right. These obstructive findings aer secondary to a distal calculus measuring approximately 3 mm at the vesicoureteral junction (image 90, series 2). The bladder is low within the pelvis resultant from prior surgeries removing the uterus and colon.  There are 8 additional right renal calculi ranging size from 2-7 mm. There 6 left renal calculi ranging size from 4 to 7 mm. No left ureterolithiasis.  Stomach/Bowel: The stomach, stomach, duodenum, and small bowel are normal thyroid obstruction. There is a right lower quadrant ileostomy.  There is peristomal hernia without evidence of obstruction. No evidence of small bowel inflammation, abscess, or fistula.  Vascular/Lymphatic: Abdominal aorta is normal caliber. There is no retroperitoneal or periportal lymphadenopathy. No pelvic lymphadenopathy.  Reproductive: Surgically absent uterus  Musculoskeletal: No aggressive osseous lesion.  Other: No free fluid the abdomen pelvis.  IMPRESSION: 1. Obstructing calculus within the distal right ureter at the the vesicoureteral junction with mild hydronephrosis and hydroureter on the right as well as renal edema. 2. Bilateral nephrolithiasis. 3. The bladder is low and posterior in the pelvis. 4. Right lower quadrant ileostomy. No evidence of small bowel inflammation.   Electronically Signed   By: Genevive Bi M.D.   On: 04/01/2014 16:13   Dg Abd Acute W/chest  04/01/2014   CLINICAL DATA:  44 year old female with lower abdominal pain radiating into the back  EXAM: ACUTE ABDOMEN SERIES (ABDOMEN 2 VIEW & CHEST 1 VIEW)  COMPARISON:  Chest x-ray 03/23/2014; prior CT abdomen/ pelvis 10/10/2012  FINDINGS: Normal cardiac and mediastinal contours.  The lungs are clear.  No evidence of free air. The bowel gas pattern is not obstructed. Surgical clips in the right upper quadrant suggest prior cholecystectomy. Right lower quadrant ileostomy. Numerous opacities project over both kidneys consistent with nephrolithiasis. Several of the calcifications demonstrated appearance consistent with a renal appear mid this suggesting medullary nephrocalcinosis. No definite stone along the course of the ureters. Stable bone island in the right iliac wing. No acute osseous abnormality.  IMPRESSION: 1. 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 2. Normal bowel gas pattern without evidence of obstruction. 3. Surgical changes of prior cholecystectomy and right lower quadrant ileostomy. 4. No acute cardiopulmonary process.   Electronically Signed   By: Malachy Moan M.D.   On: 04/01/2014 13:10     EKG Interpretation None      MDM   Final diagnoses:  Abdominal pain, acute  Renal colic  Right ureteral stone  Nephrolithiasis    BROOKS STOTZ presents with sudden onset lower abdominal pain. Patient reports history of small bowel obstruction with similar symptoms in the past. She is a history of Crohn's disease and denies any sort of recent flare.  She denies taking any medications at home. Will give pain control, check labs and obtain plain film.  1:30 PM Plain film without evidence of bowel obstruction and normal gas pattern. Bilateral nephrolithiasis noted.  Will obtain CT scan for further characterization of patient's pain.  Repeat pain control.  The patient was discussed with and seen by  Dr. Donnald Garre who  Recommends discharge home if patient CT is negative.   5:00 PM CT scan with obstructing calculus measured at 3 mm and the right distal ureter with renal edema. Discussed with Dr Laverle Patter who reviewed the imaging with me.  He is comfortable with d/c home and office follow-up.    No evidence of infection. Patient is afebrile and without evidence of UTI on UA. Her pain is controlled. She does not have intractable vomiting and she has been in to tolerate by mouth fluids here in the emergency department.  I have personally reviewed patient's vitals, nursing note and any pertinent labs or imaging.  I performed an focused physical exam; undressed when appropriate .    It has been determined that no acute conditions requiring further emergency intervention are present at this time. The patient/guardian have been advised of the diagnosis and plan. I reviewed any labs and imaging including any potential incidental findings. We have discussed signs and  symptoms that warrant return to the ED and they are listed in the discharge instructions.    Vital signs are stable at discharge.   BP 126/64 mmHg  Pulse 83  Temp(Src) 98.6 F (37 C) (Oral)  Resp 18  Ht 5\' 7"  (1.702 m)  Wt 292 lb (132.45 kg)  BMI 45.72 kg/m2  SpO2 96%  LMP 10/18/2010        Dierdre ForthHannah Mazi Schuff, PA-C 04/01/14 1746  Arby BarretteMarcy Pfeiffer, MD 04/02/14 [phone]

## 2014-11-19 ENCOUNTER — Other Ambulatory Visit (HOSPITAL_BASED_OUTPATIENT_CLINIC_OR_DEPARTMENT_OTHER): Payer: Self-pay | Admitting: Obstetrics and Gynecology

## 2014-11-19 DIAGNOSIS — Z1231 Encounter for screening mammogram for malignant neoplasm of breast: Secondary | ICD-10-CM

## 2014-12-21 ENCOUNTER — Ambulatory Visit (HOSPITAL_BASED_OUTPATIENT_CLINIC_OR_DEPARTMENT_OTHER)
Admission: RE | Admit: 2014-12-21 | Discharge: 2014-12-21 | Disposition: A | Discharge status: Home / Self Care | Payer: BLUE CROSS/BLUE SHIELD | Source: Ambulatory Visit | Attending: Obstetrics and Gynecology | Admitting: Obstetrics and Gynecology

## 2014-12-21 DIAGNOSIS — Z1231 Encounter for screening mammogram for malignant neoplasm of breast: Secondary | ICD-10-CM | POA: Insufficient documentation

## 2015-11-22 ENCOUNTER — Other Ambulatory Visit (HOSPITAL_BASED_OUTPATIENT_CLINIC_OR_DEPARTMENT_OTHER): Payer: Self-pay | Admitting: Obstetrics and Gynecology

## 2015-11-22 DIAGNOSIS — Z1231 Encounter for screening mammogram for malignant neoplasm of breast: Secondary | ICD-10-CM

## 2015-12-23 ENCOUNTER — Ambulatory Visit (HOSPITAL_BASED_OUTPATIENT_CLINIC_OR_DEPARTMENT_OTHER)
Admission: RE | Admit: 2015-12-23 | Discharge: 2015-12-23 | Disposition: A | Discharge status: Home / Self Care | Payer: BLUE CROSS/BLUE SHIELD | Source: Ambulatory Visit | Attending: Obstetrics and Gynecology | Admitting: Obstetrics and Gynecology

## 2015-12-23 DIAGNOSIS — Z1231 Encounter for screening mammogram for malignant neoplasm of breast: Secondary | ICD-10-CM | POA: Insufficient documentation

## 2016-11-17 ENCOUNTER — Other Ambulatory Visit (HOSPITAL_BASED_OUTPATIENT_CLINIC_OR_DEPARTMENT_OTHER): Payer: Self-pay | Admitting: *Deleted

## 2016-11-17 ENCOUNTER — Other Ambulatory Visit (HOSPITAL_BASED_OUTPATIENT_CLINIC_OR_DEPARTMENT_OTHER): Payer: Self-pay | Admitting: Obstetrics and Gynecology

## 2016-11-17 DIAGNOSIS — Z1231 Encounter for screening mammogram for malignant neoplasm of breast: Secondary | ICD-10-CM

## 2016-12-25 ENCOUNTER — Ambulatory Visit (HOSPITAL_BASED_OUTPATIENT_CLINIC_OR_DEPARTMENT_OTHER)
Admission: RE | Admit: 2016-12-25 | Discharge: 2016-12-25 | Disposition: A | Discharge status: Home / Self Care | Payer: BLUE CROSS/BLUE SHIELD | Source: Ambulatory Visit | Attending: Obstetrics and Gynecology | Admitting: Obstetrics and Gynecology

## 2016-12-25 ENCOUNTER — Encounter (HOSPITAL_BASED_OUTPATIENT_CLINIC_OR_DEPARTMENT_OTHER): Payer: Self-pay

## 2016-12-25 DIAGNOSIS — Z1231 Encounter for screening mammogram for malignant neoplasm of breast: Secondary | ICD-10-CM | POA: Insufficient documentation

## 2017-11-27 ENCOUNTER — Other Ambulatory Visit (HOSPITAL_BASED_OUTPATIENT_CLINIC_OR_DEPARTMENT_OTHER): Payer: Self-pay | Admitting: Obstetrics and Gynecology

## 2017-11-27 DIAGNOSIS — Z1231 Encounter for screening mammogram for malignant neoplasm of breast: Secondary | ICD-10-CM

## 2017-12-31 ENCOUNTER — Ambulatory Visit (HOSPITAL_BASED_OUTPATIENT_CLINIC_OR_DEPARTMENT_OTHER)
Admission: RE | Admit: 2017-12-31 | Discharge: 2017-12-31 | Disposition: A | Discharge status: Home / Self Care | Payer: BLUE CROSS/BLUE SHIELD | Source: Ambulatory Visit | Attending: Obstetrics and Gynecology | Admitting: Obstetrics and Gynecology

## 2017-12-31 DIAGNOSIS — Z1231 Encounter for screening mammogram for malignant neoplasm of breast: Secondary | ICD-10-CM | POA: Diagnosis not present

## 2018-12-04 ENCOUNTER — Other Ambulatory Visit (HOSPITAL_BASED_OUTPATIENT_CLINIC_OR_DEPARTMENT_OTHER): Payer: Self-pay | Admitting: Obstetrics and Gynecology

## 2018-12-04 DIAGNOSIS — Z1231 Encounter for screening mammogram for malignant neoplasm of breast: Secondary | ICD-10-CM

## 2019-01-01 ENCOUNTER — Other Ambulatory Visit: Payer: Self-pay

## 2019-01-01 ENCOUNTER — Ambulatory Visit (HOSPITAL_BASED_OUTPATIENT_CLINIC_OR_DEPARTMENT_OTHER)
Admission: RE | Admit: 2019-01-01 | Discharge: 2019-01-01 | Disposition: A | Discharge status: Home / Self Care | Payer: BC Managed Care – PPO | Source: Ambulatory Visit | Attending: Obstetrics and Gynecology | Admitting: Obstetrics and Gynecology

## 2019-01-01 DIAGNOSIS — Z1231 Encounter for screening mammogram for malignant neoplasm of breast: Secondary | ICD-10-CM

## 2019-02-27 ENCOUNTER — Ambulatory Visit: Payer: BC Managed Care – PPO | Attending: Internal Medicine

## 2019-02-27 DIAGNOSIS — Z20822 Contact with and (suspected) exposure to covid-19: Secondary | ICD-10-CM

## 2019-02-28 ENCOUNTER — Other Ambulatory Visit: Payer: BC Managed Care – PPO

## 2019-03-01 LAB — NOVEL CORONAVIRUS, NAA: SARS-CoV-2, NAA: DETECTED — AB

## 2019-03-13 ENCOUNTER — Other Ambulatory Visit: Payer: BC Managed Care – PPO

## 2019-03-13 ENCOUNTER — Other Ambulatory Visit: Payer: Self-pay

## 2019-03-13 ENCOUNTER — Encounter (HOSPITAL_COMMUNITY): Payer: Self-pay

## 2019-03-13 ENCOUNTER — Ambulatory Visit (HOSPITAL_COMMUNITY)
Admission: EM | Admit: 2019-03-13 | Discharge: 2019-03-13 | Disposition: A | Discharge status: Home / Self Care | Payer: BC Managed Care – PPO | Attending: Physician Assistant | Admitting: Physician Assistant

## 2019-03-13 DIAGNOSIS — R0602 Shortness of breath: Secondary | ICD-10-CM | POA: Insufficient documentation

## 2019-03-13 DIAGNOSIS — Z8719 Personal history of other diseases of the digestive system: Secondary | ICD-10-CM | POA: Diagnosis present

## 2019-03-13 DIAGNOSIS — R05 Cough: Secondary | ICD-10-CM

## 2019-03-13 DIAGNOSIS — Z8616 Personal history of COVID-19: Secondary | ICD-10-CM | POA: Insufficient documentation

## 2019-03-13 LAB — CBC
HCT: 41.4 % (ref 36.0–46.0)
Hemoglobin: 13.6 g/dL (ref 12.0–15.0)
MCH: 29.4 pg (ref 26.0–34.0)
MCHC: 32.9 g/dL (ref 30.0–36.0)
MCV: 89.4 fL (ref 80.0–100.0)
Platelets: 259 10*3/uL (ref 150–400)
RBC: 4.63 MIL/uL (ref 3.87–5.11)
RDW: 13.3 % (ref 11.5–15.5)
WBC: 18 10*3/uL — ABNORMAL HIGH (ref 4.0–10.5)
nRBC: 0 % (ref 0.0–0.2)

## 2019-03-13 MED ORDER — SUCRALFATE 1 GM/10ML PO SUSP: 1.0000 g | Freq: Three times a day (TID) | ORAL | 0 refills | Status: AC

## 2019-03-13 MED ORDER — NYSTATIN 100000 UNIT/ML MT SUSP: 5.0000 mL | Freq: Four times a day (QID) | OROMUCOSAL | 0 refills | Status: AC

## 2019-03-13 NOTE — ED Provider Notes (Signed)
[place]    CSN: [id number] Arrival date & time: 03/13/19  0807      History   Chief Complaint Chief Complaint  Patient presents with  . Shortness of Breath    HPI Sharon Moon is a 49 y.o. female.   Patient was a past medical history of Crohns disease s/p proctocolectomy with stoma (remote), who is presenting for continued shortness of breath and cough following covid diagnosis on 02/27/2019. She notes these symptoms have improved since her symptoms began. She does note low grade temperatures of 28F. This seems to occur after being active during the day and she begins to feel poorly. She describes this as feeling fatigued and short of breath with exertion. She denies these symptoms when resting.   She was placed on dexamethasone, doxycyline and ivermectin by her primary care due to COVID and crohns. She is currently concerned she is having a crohns flare and is in need of an infusion. She reports some dark red color recently in her stool. As well, she notes some esophageal pain and sore throat, which she believes is due to thrush. She endorses a history of thrush when being treated with steroids and notes she tends to have esophageal pains when having crohns flares.   She reports her infusion clinic is concerned about her continuing to be infectious with COVID, and may require a negative covid test for entry at this point.      Past Medical History:  Diagnosis Date  . Crohn's colitis ([place])   . Diabetes mellitus without complication ([place])   . Sleep apnea   . TB (pulmonary tuberculosis)     There are no problems to display for this patient.   Past Surgical History:  Procedure Laterality Date  . ABDOMINAL HYSTERECTOMY    . CHOLECYSTECTOMY    . COLON SURGERY    . ILEOSTOMY    . proctocoloectomy    . REDUCTION MAMMAPLASTY     1990  . vaginal flap      OB History   No obstetric history on file.      Home Medications    Prior to Admission medications    Medication Sig Start Date End Date Taking? Authorizing Provider  Apremilast (OTEZLA) 30 MG TABS TAKE ONE TABLET BY MOUTH TWICE DAILY. TAKE WITH OR WITHOUT FOOD. DO NOT CRUSH, CHEW OR SPLIT TABLET. STORE AT ROOM TEMPERATURE. 12/03/17  Yes [provider]  doxycycline (VIBRAMYCIN) 100 MG capsule TAKE ONE CAPSULE BY MOUTH TWICE A DAY 01/20/19  Yes [provider]  metFORMIN (GLUCOPHAGE) 500 MG tablet Take by mouth. 09/26/12  Yes [provider]  RABEprazole (ACIPHEX) 20 MG tablet Take 1 tablet by mouth every day 07/03/12  Yes [provider]  zolpidem (AMBIEN CR) 12.5 MG CR tablet TAKE 1 TABLET BY MOUTH NIGHTLY AS NEEDEDFOR SLEEP 08/24/16  Yes [provider]  gabapentin (NEURONTIN) 300 MG capsule  12/19/18   [provider]  nystatin (MYCOSTATIN) 100000 UNIT/ML suspension Take 5 mLs (500,000 Units total) by mouth 4 (four) times daily for 7 days. 03/13/19 03/20/19  Saylor Sheckler, Marguerita Beards, PA-C  ondansetron (ZOFRAN ODT) 4 MG disintegrating tablet 4mg  ODT q4 hours prn nausea/vomit 04/01/14   Muthersbaugh, Jarrett Soho, PA-C  oxyCODONE-acetaminophen (PERCOCET) 5-325 MG per tablet Take 1-2 tablets by mouth every 4 (four) hours as needed. 04/01/14   Muthersbaugh, Jarrett Soho, PA-C  sucralfate (CARAFATE) 1 GM/10ML suspension Take 10 mLs (1 g total) by mouth 4 (four) times daily -  with meals and at bedtime. 03/13/19   Syana Degraffenreid, Veryl Speak, PA-C    Family History Family History  Problem Relation Age of Onset  . Diabetes Mother   . Healthy Father     Social History Social History   Tobacco Use  . Smoking status: Never Smoker  . Smokeless tobacco: Never Used  Substance Use Topics  . Alcohol use: Yes    Comment: occ  . Drug use: No     Allergies   Reglan [metoclopramide] and Vancomycin   Review of Systems Review of Systems  Constitutional: Positive for activity change and fatigue. Negative for chills and fever.  HENT: Negative for congestion, ear pain, sinus pressure,  sinus pain, sneezing and sore throat.   Eyes: Negative for pain and visual disturbance.  Respiratory: Positive for cough and shortness of breath.   Cardiovascular: Negative for chest pain and palpitations.  Gastrointestinal: Positive for blood in stool. Negative for abdominal pain, anal bleeding, diarrhea, nausea and vomiting.  Genitourinary: Negative for dysuria and hematuria.  Musculoskeletal: Negative for arthralgias, back pain and myalgias.  Skin: Negative for color change and rash.  Neurological: Negative for seizures, syncope and headaches.  All other systems reviewed and are negative.    Physical Exam Triage Vital Signs ED Triage Vitals  Enc Vitals Group     BP      Pulse      Resp      Temp      Temp src      SpO2      Weight      Height      Head Circumference      Peak Flow      Pain Score      Pain Loc      Pain Edu?      Excl. in GC?    No data found.  Updated Vital Signs BP 116/78 (BP Location: Right Arm)   Pulse 97   Temp 98.9 F (37.2 C) (Oral)   Resp 19   LMP 10/18/2010   SpO2 98%   Visual Acuity Right Eye Distance:   Left Eye Distance:   Bilateral Distance:    Right Eye Near:   Left Eye Near:    Bilateral Near:     Physical Exam Vitals and nursing note reviewed.  Constitutional:      General: She is not in acute distress.    Appearance: She is well-developed. She is not ill-appearing or toxic-appearing.  HENT:     Head: Normocephalic and atraumatic.  Eyes:     Conjunctiva/sclera: Conjunctivae normal.  Cardiovascular:     Rate and Rhythm: Normal rate and regular rhythm.     Heart sounds: No murmur.  Pulmonary:     Effort: Pulmonary effort is normal. No tachypnea, accessory muscle usage or respiratory distress.     Breath sounds: Normal breath sounds. No decreased breath sounds, wheezing, rhonchi or rales.  Abdominal:     Palpations: Abdomen is soft. There is no hepatomegaly or splenomegaly.     Tenderness: There is no abdominal  tenderness.     Comments: Stoma in place RLQ with brown/mucoid stool. No sign of blood or maroon color. No erythema surrounding. No ttp   Musculoskeletal:     Cervical back: Neck supple.     Right lower leg: No edema.     Left lower leg: No edema.  Skin:    General: Skin is warm and dry.     Capillary Refill: Capillary  refill takes less than 2 seconds.  Neurological:     General: No focal deficit present.     Mental Status: She is alert and oriented to person, place, and time.  Psychiatric:        Mood and Affect: Mood normal.        Behavior: Behavior normal.      UC Treatments / Results  Labs (all labs ordered are listed, but only abnormal results are displayed) Labs Reviewed  CBC    EKG   Radiology No results found.  Procedures Procedures (including critical care time)  Medications Ordered in UC Medications - No data to display  Initial Impression / Assessment and Plan / UC Course  I have reviewed the triage vital signs and the nursing notes.  Pertinent labs & imaging results that were available during my care of the patient were reviewed by me and considered in my medical decision making (see chart for details).     #History of Covid #Shortness of breath #History of Crohns - Overall improvement in respiratory symptoms and certainly no worsening. Her history of crohns and concern for flare, clouds the picture some. CBC sent to check Hemoglobin levels given stool color changes. Discussed that COVID testing, at this point would likely remain positive, but clinically she has improved and believe she should go forth with Crohns treatments.  - Based on steroid use and history, may have early thrush, though clinically not obvious. Nystatin mouth wash sent. Carafate given as well.  - Follow up with PCP if letter provided in is insufficient  - ED precautions discussed and education about recovery process given.   Final Clinical Impressions(s) / UC Diagnoses   Final  diagnoses:  Shortness of breath  History of COVID-19  History of Crohn's disease     Discharge Instructions     Please call the infusion center and tell them we believe you have recovered sufficiently from COVID to be non-infectious currently. Your lingering cough and shortness of breath are expected during recovery. Repeat testing for COVID within 3 months of a positive diagnosis is not currently recommended at our facility, due to individuals continuing to test positive despite clinical improvement.  We have sent some blood, work I will notify you of any concerning results.  I have supplied a letter stating your clinical improvement. If this is insufficient for the infusion clinic, please contact your primary care to further discuss a repeat COVID test.   I have sent in nystatin mouth wash to be used 4 times a day for 7 days, if this does not improve your throat pain please follow up with your primary care  I have also sent in carafate to help with your esophageal pain, please use this 3 times a day with meals.   If you are not improving at all, please follow up with your primary care and GI doctors to discuss symptom management.  Go directly to the Emergency Department or call 911 if you have severe chest pain, worsening shortness of breath, severe bloody diarrhea or feel as though you might pass out.        ED Prescriptions    Medication Sig Dispense Auth. Provider   nystatin (MYCOSTATIN) 100000 UNIT/ML suspension Take 5 mLs (500,000 Units total) by mouth 4 (four) times daily for 7 days. 473 mL Amaury Kuzel, Veryl Speak, PA-C   sucralfate (CARAFATE) 1 GM/10ML suspension Take 10 mLs (1 g total) by mouth 4 (four) times daily -  with meals and at  bedtime. 420 mL Rae Plotner, Veryl Speak, PA-C     PDMP not reviewed this encounter.   Hermelinda Medicus, PA-C 03/13/19 1013

## 2019-03-13 NOTE — Discharge Instructions (Addendum)
Please call the infusion center and tell them we believe you have recovered sufficiently from COVID to be non-infectious currently. Your lingering cough and shortness of breath are expected during recovery. Repeat testing for COVID within 3 months of a positive diagnosis is not currently recommended at our facility, due to individuals continuing to test positive despite clinical improvement.  We have sent some blood, work I will notify you of any concerning results.  I have supplied a letter stating your clinical improvement. If this is insufficient for the infusion clinic, please contact your primary care to further discuss a repeat COVID test.   I have sent in nystatin mouth wash to be used 4 times a day for 7 days, if this does not improve your throat pain please follow up with your primary care  I have also sent in carafate to help with your esophageal pain, please use this 3 times a day with meals.   If you are not improving at all, please follow up with your primary care and GI doctors to discuss symptom management.  Go directly to the Emergency Department or call 911 if you have severe chest pain, worsening shortness of breath, severe bloody diarrhea or feel as though you might pass out.

## 2019-03-13 NOTE — ED Triage Notes (Signed)
Patient presents to Urgent Care with complaints of continued shortness of breath since testing positive for covid two weeks ago. Patient reports she has been placed on several prescription drugs by her pcp and has been taking as directed. Pt also cannot get her fever to go away.

## 2019-12-02 ENCOUNTER — Other Ambulatory Visit (HOSPITAL_BASED_OUTPATIENT_CLINIC_OR_DEPARTMENT_OTHER): Payer: Self-pay | Admitting: Family Medicine

## 2019-12-02 DIAGNOSIS — Z1231 Encounter for screening mammogram for malignant neoplasm of breast: Secondary | ICD-10-CM

## 2020-01-12 ENCOUNTER — Ambulatory Visit (HOSPITAL_BASED_OUTPATIENT_CLINIC_OR_DEPARTMENT_OTHER)
Admission: RE | Admit: 2020-01-12 | Discharge: 2020-01-12 | Disposition: A | Discharge status: Home / Self Care | Payer: BC Managed Care – PPO | Source: Ambulatory Visit | Attending: Family Medicine | Admitting: Family Medicine

## 2020-01-12 ENCOUNTER — Encounter (HOSPITAL_BASED_OUTPATIENT_CLINIC_OR_DEPARTMENT_OTHER): Payer: Self-pay

## 2020-01-12 ENCOUNTER — Other Ambulatory Visit: Payer: Self-pay

## 2020-01-12 DIAGNOSIS — Z1231 Encounter for screening mammogram for malignant neoplasm of breast: Secondary | ICD-10-CM | POA: Diagnosis present

## 2020-10-12 ENCOUNTER — Other Ambulatory Visit: Payer: Self-pay | Admitting: Family Medicine

## 2020-10-12 DIAGNOSIS — M79642 Pain in left hand: Secondary | ICD-10-CM

## 2020-10-12 NOTE — Progress Notes (Signed)
Pain in the bilat thenar eminence adn positive grind test - ongoing for weeks. No change regardless of conservative measures - rest, position change, ergonomic changes at work, topical NSAIDs.

## 2020-10-13 ENCOUNTER — Ambulatory Visit
Admission: RE | Admit: 2020-10-13 | Discharge: 2020-10-13 | Disposition: A | Discharge status: Home / Self Care | Payer: BC Managed Care – PPO | Source: Ambulatory Visit | Attending: Family Medicine | Admitting: Family Medicine

## 2020-10-13 DIAGNOSIS — M79641 Pain in right hand: Secondary | ICD-10-CM

## 2020-10-26 ENCOUNTER — Other Ambulatory Visit: Payer: Self-pay | Admitting: Family Medicine

## 2020-10-26 DIAGNOSIS — R0781 Pleurodynia: Secondary | ICD-10-CM

## 2020-11-05 ENCOUNTER — Ambulatory Visit
Admission: RE | Admit: 2020-11-05 | Discharge: 2020-11-05 | Disposition: A | Discharge status: Home / Self Care | Payer: 59 | Source: Ambulatory Visit | Attending: Family Medicine | Admitting: Family Medicine

## 2020-11-05 DIAGNOSIS — R0781 Pleurodynia: Secondary | ICD-10-CM

## 2020-12-16 ENCOUNTER — Other Ambulatory Visit (HOSPITAL_BASED_OUTPATIENT_CLINIC_OR_DEPARTMENT_OTHER): Payer: Self-pay | Admitting: Obstetrics and Gynecology

## 2020-12-16 DIAGNOSIS — Z1231 Encounter for screening mammogram for malignant neoplasm of breast: Secondary | ICD-10-CM

## 2021-01-24 ENCOUNTER — Ambulatory Visit (HOSPITAL_BASED_OUTPATIENT_CLINIC_OR_DEPARTMENT_OTHER)
Admission: RE | Admit: 2021-01-24 | Discharge: 2021-01-24 | Disposition: A | Discharge status: Home / Self Care | Payer: 59 | Source: Ambulatory Visit | Attending: Obstetrics and Gynecology | Admitting: Obstetrics and Gynecology

## 2021-01-24 ENCOUNTER — Other Ambulatory Visit: Payer: Self-pay

## 2021-01-24 ENCOUNTER — Encounter (HOSPITAL_BASED_OUTPATIENT_CLINIC_OR_DEPARTMENT_OTHER): Payer: Self-pay

## 2021-01-24 DIAGNOSIS — Z1231 Encounter for screening mammogram for malignant neoplasm of breast: Secondary | ICD-10-CM | POA: Diagnosis present

## 2022-02-27 ENCOUNTER — Other Ambulatory Visit (HOSPITAL_BASED_OUTPATIENT_CLINIC_OR_DEPARTMENT_OTHER): Payer: Self-pay | Admitting: Obstetrics and Gynecology

## 2022-02-27 DIAGNOSIS — Z1231 Encounter for screening mammogram for malignant neoplasm of breast: Secondary | ICD-10-CM

## 2022-03-08 ENCOUNTER — Encounter (HOSPITAL_BASED_OUTPATIENT_CLINIC_OR_DEPARTMENT_OTHER): Payer: Self-pay

## 2022-03-08 ENCOUNTER — Ambulatory Visit (HOSPITAL_BASED_OUTPATIENT_CLINIC_OR_DEPARTMENT_OTHER)
Admission: RE | Admit: 2022-03-08 | Discharge: 2022-03-08 | Disposition: A | Discharge status: Home / Self Care | Payer: 59 | Source: Ambulatory Visit | Attending: Obstetrics and Gynecology | Admitting: Obstetrics and Gynecology

## 2022-03-08 DIAGNOSIS — Z1231 Encounter for screening mammogram for malignant neoplasm of breast: Secondary | ICD-10-CM

## 2023-03-05 ENCOUNTER — Other Ambulatory Visit (HOSPITAL_BASED_OUTPATIENT_CLINIC_OR_DEPARTMENT_OTHER): Payer: Self-pay | Admitting: Family Medicine

## 2023-03-05 DIAGNOSIS — Z1231 Encounter for screening mammogram for malignant neoplasm of breast: Secondary | ICD-10-CM

## 2023-03-26 ENCOUNTER — Ambulatory Visit (HOSPITAL_BASED_OUTPATIENT_CLINIC_OR_DEPARTMENT_OTHER)
Admission: RE | Admit: 2023-03-26 | Discharge: 2023-03-26 | Disposition: A | Discharge status: Home / Self Care | Payer: 59 | Source: Ambulatory Visit | Attending: Family Medicine | Admitting: Family Medicine

## 2023-03-26 ENCOUNTER — Encounter (HOSPITAL_BASED_OUTPATIENT_CLINIC_OR_DEPARTMENT_OTHER): Payer: Self-pay

## 2023-03-26 DIAGNOSIS — Z1231 Encounter for screening mammogram for malignant neoplasm of breast: Secondary | ICD-10-CM | POA: Insufficient documentation

## 2023-04-16 IMAGING — MG MM DIGITAL SCREENING BILAT W/ TOMO AND CAD
6 of 12 series · 6 of 36 positions shown · non-contrast
Comparison: Previous exam(s).

CLINICAL DATA: Screening.

EXAM:
DIGITAL SCREENING BILATERAL MAMMOGRAM WITH TOMOSYNTHESIS AND CAD
TECHNIQUE: Bilateral screening digital craniocaudal and mediolateral oblique
mammograms were obtained. Bilateral screening digital breast
tomosynthesis was performed. The images were evaluated with
computer-aided detection.

[R MLO synth-2D]
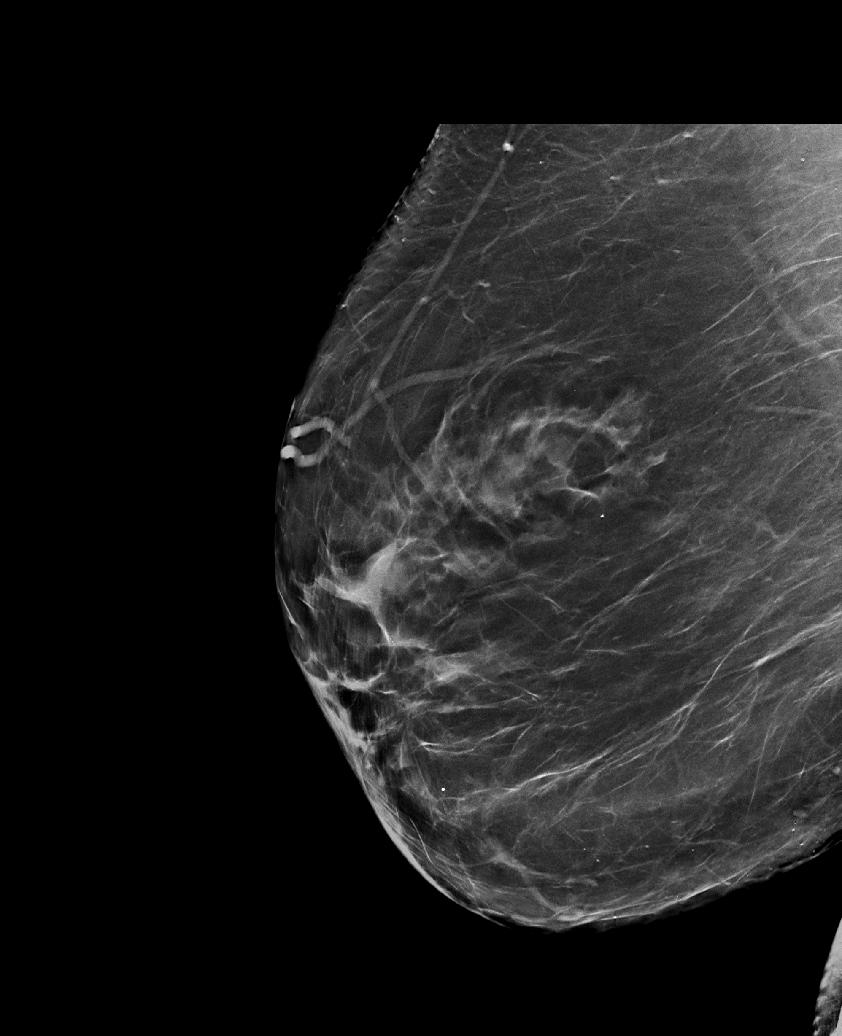

[L MLO synth-2D]
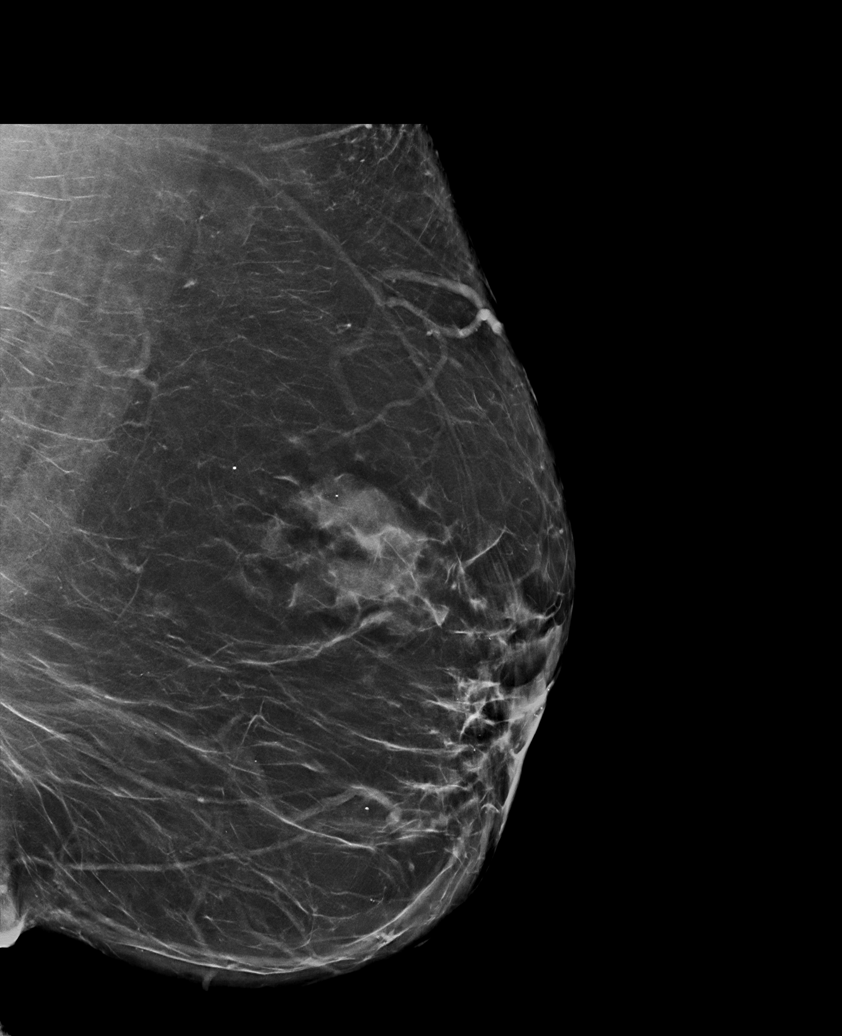

[R CC synth-2D (1 of 2)]
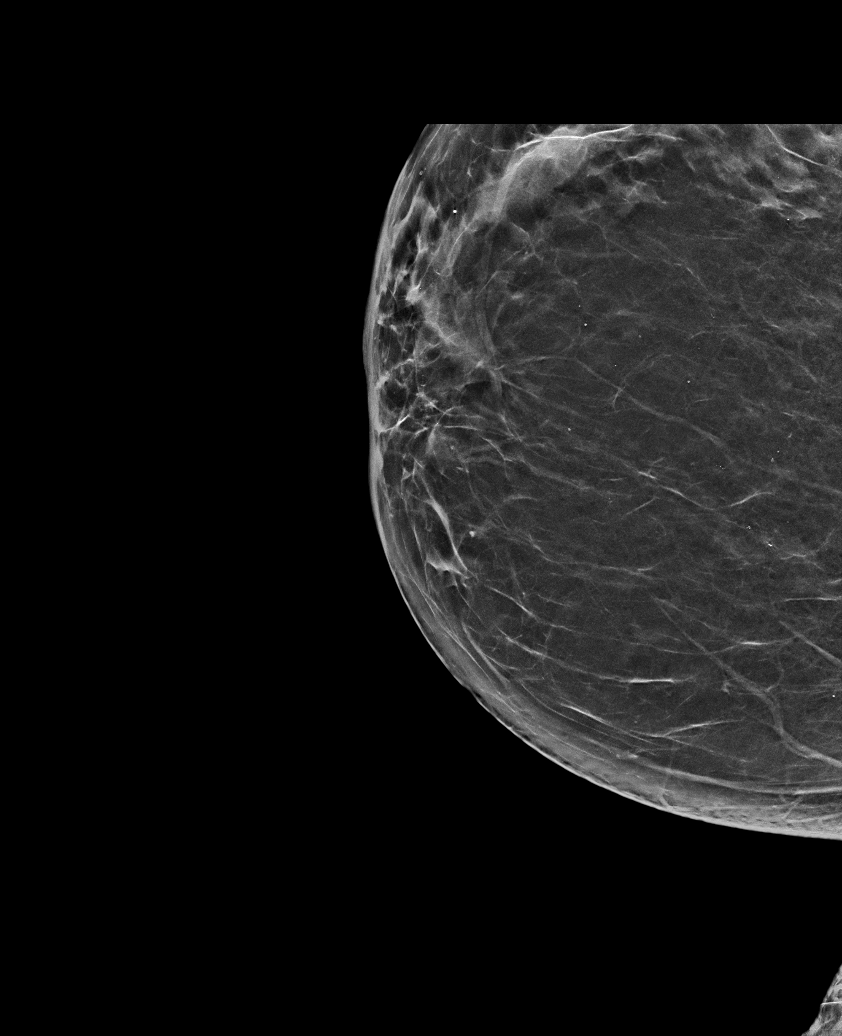

[L CC synth-2D (1 of 2)]
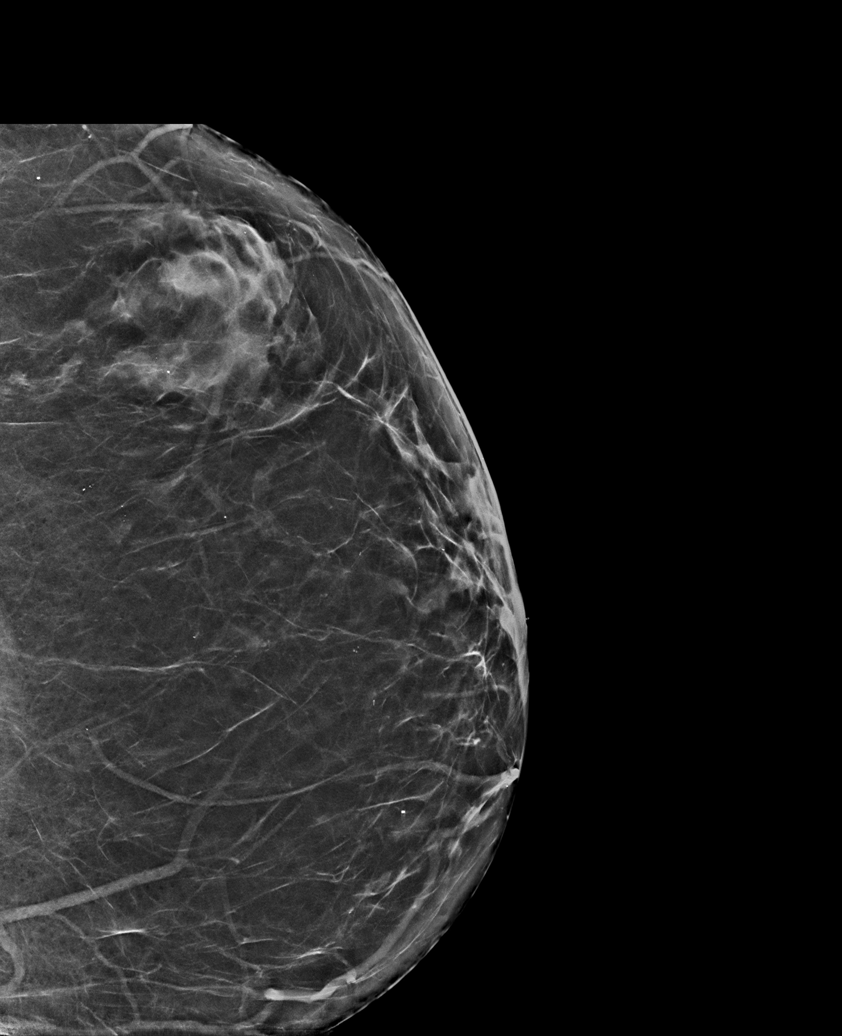

[R CC synth-2D (2 of 2)]
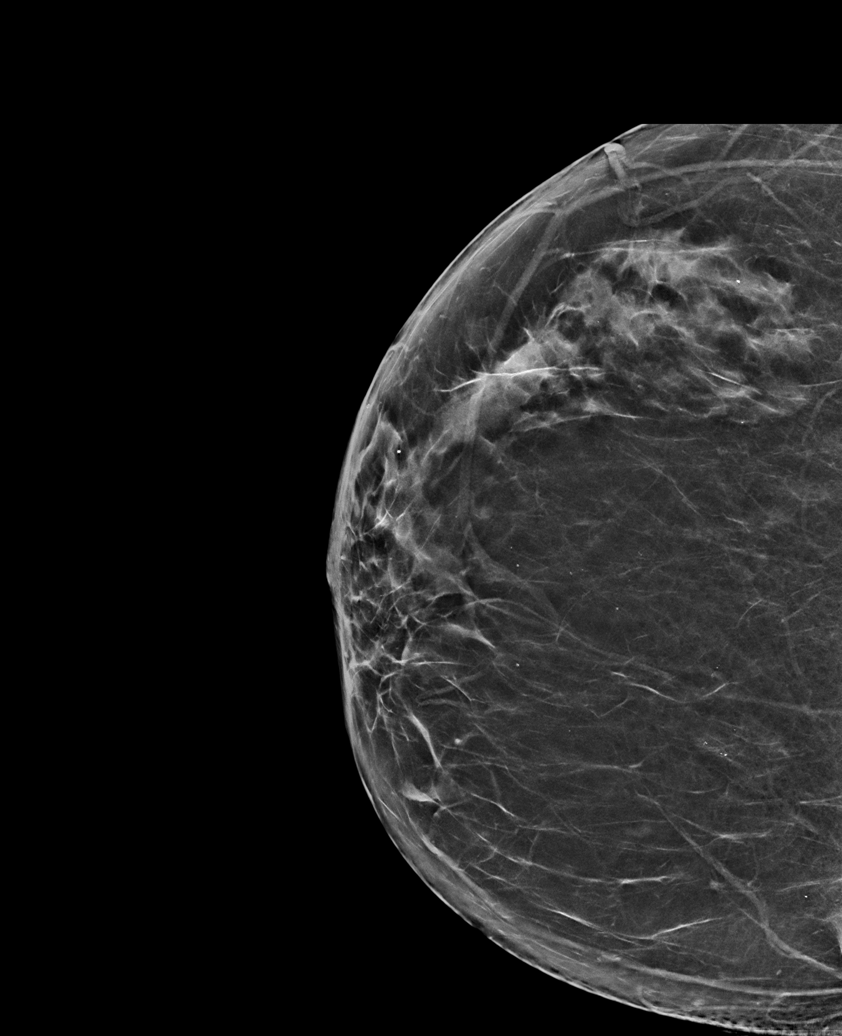

[L CC synth-2D (2 of 2)]
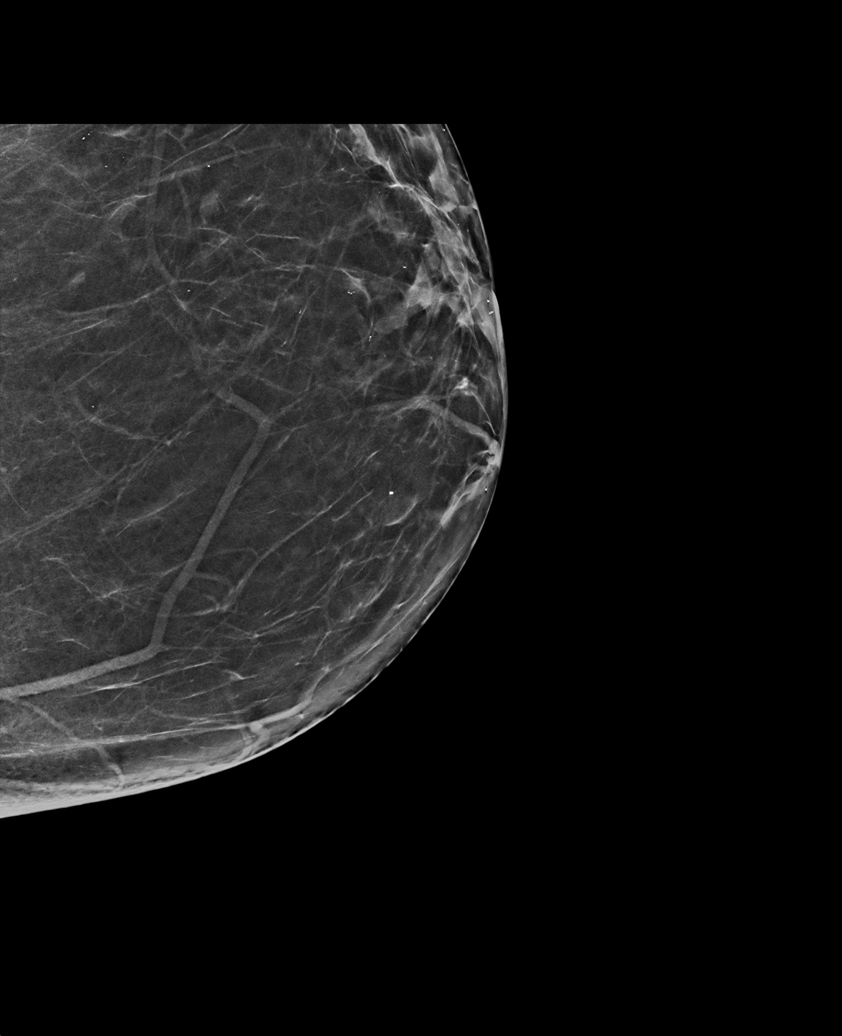

[6 of 36 positions shown; findings below may reference images not displayed]

ACR Breast Density Category b: There are scattered areas of
fibroglandular density.
FINDINGS: There are no findings suspicious for malignancy.
IMPRESSION: No mammographic evidence of malignancy. A result letter of this
screening mammogram will be mailed directly to the patient.

RECOMMENDATION:
Screening mammogram in one year. (Code:51-O-LD2)

BI-RADS CATEGORY  1: Negative.

## 2024-03-03 ENCOUNTER — Other Ambulatory Visit (HOSPITAL_BASED_OUTPATIENT_CLINIC_OR_DEPARTMENT_OTHER): Payer: Self-pay | Admitting: Obstetrics and Gynecology

## 2024-03-03 DIAGNOSIS — Z1231 Encounter for screening mammogram for malignant neoplasm of breast: Secondary | ICD-10-CM

## 2024-03-24 ENCOUNTER — Other Ambulatory Visit: Payer: Self-pay | Admitting: Medical Genetics

## 2024-03-31 ENCOUNTER — Inpatient Hospital Stay (HOSPITAL_BASED_OUTPATIENT_CLINIC_OR_DEPARTMENT_OTHER): Admission: RE | Admit: 2024-03-31 | Source: Ambulatory Visit

## 2024-04-23 ENCOUNTER — Other Ambulatory Visit (HOSPITAL_COMMUNITY)
